# Patient Record
Sex: Male | Born: 1955
Health system: Southern US, Community
[De-identification: ages and names within clinical notes are randomized; demographics above are authoritative.]

## PROBLEM LIST (undated history)

## (undated) DIAGNOSIS — Z87442 Personal history of urinary calculi: Secondary | ICD-10-CM

## (undated) DIAGNOSIS — M5126 Other intervertebral disc displacement, lumbar region: Secondary | ICD-10-CM

## (undated) DIAGNOSIS — M5416 Radiculopathy, lumbar region: Secondary | ICD-10-CM

## (undated) DIAGNOSIS — E119 Type 2 diabetes mellitus without complications: Secondary | ICD-10-CM

## (undated) DIAGNOSIS — E785 Hyperlipidemia, unspecified: Secondary | ICD-10-CM

## (undated) DIAGNOSIS — M1990 Unspecified osteoarthritis, unspecified site: Secondary | ICD-10-CM

## (undated) DIAGNOSIS — B019 Varicella without complication: Secondary | ICD-10-CM

## (undated) DIAGNOSIS — M23204 Derangement of unspecified medial meniscus due to old tear or injury, left knee: Secondary | ICD-10-CM

## (undated) DIAGNOSIS — E781 Pure hyperglyceridemia: Secondary | ICD-10-CM

## (undated) HISTORY — DX: Personal history of urinary calculi: Z87.442

## (undated) HISTORY — PX: TRACHEOSTOMY: SUR1362

## (undated) HISTORY — DX: Varicella without complication: B01.9

## (undated) HISTORY — DX: Unspecified osteoarthritis, unspecified site: M19.90

## (undated) HISTORY — PX: TONSILECTOMY, ADENOIDECTOMY, BILATERAL MYRINGOTOMY AND TUBES: SHX2538

## (undated) HISTORY — DX: Type 2 diabetes mellitus without complications: E11.9

## (undated) HISTORY — DX: Other intervertebral disc displacement, lumbar region: M51.26

## (undated) HISTORY — DX: Radiculopathy, lumbar region: M54.16

## (undated) HISTORY — DX: Pure hyperglyceridemia: E78.1

## (undated) HISTORY — DX: Hyperlipidemia, unspecified: E78.5

## (undated) HISTORY — DX: Derangement of unspecified medial meniscus due to old tear or injury, left knee: M23.204

---

## 2005-02-12 ENCOUNTER — Inpatient Hospital Stay (HOSPITAL_COMMUNITY): Admission: EM | Admit: 2005-02-12 | Discharge: 2005-02-16 | Payer: Self-pay | Admitting: Emergency Medicine

## 2005-03-04 ENCOUNTER — Encounter: Admission: RE | Admit: 2005-03-04 | Discharge: 2005-03-04 | Payer: Self-pay | Admitting: Neurosurgery

## 2005-05-06 ENCOUNTER — Encounter: Admission: RE | Admit: 2005-05-06 | Discharge: 2005-05-06 | Payer: Self-pay | Admitting: Neurosurgery

## 2005-06-03 ENCOUNTER — Encounter: Admission: RE | Admit: 2005-06-03 | Discharge: 2005-06-03 | Payer: Self-pay | Admitting: Neurosurgery

## 2010-08-08 ENCOUNTER — Other Ambulatory Visit: Payer: Self-pay | Admitting: Family Medicine

## 2010-08-08 DIAGNOSIS — N644 Mastodynia: Secondary | ICD-10-CM

## 2010-08-11 ENCOUNTER — Ambulatory Visit
Admission: RE | Admit: 2010-08-11 | Discharge: 2010-08-11 | Disposition: A | Discharge status: Home / Self Care | Payer: BC Managed Care – PPO | Source: Ambulatory Visit | Attending: Family Medicine | Admitting: Family Medicine

## 2010-08-11 DIAGNOSIS — N644 Mastodynia: Secondary | ICD-10-CM

## 2015-12-16 DIAGNOSIS — J329 Chronic sinusitis, unspecified: Secondary | ICD-10-CM | POA: Insufficient documentation

## 2015-12-16 DIAGNOSIS — J3089 Other allergic rhinitis: Secondary | ICD-10-CM | POA: Insufficient documentation

## 2016-06-25 ENCOUNTER — Encounter: Payer: Self-pay | Admitting: Family Medicine

## 2016-06-26 ENCOUNTER — Other Ambulatory Visit: Payer: Self-pay | Admitting: Family Medicine

## 2016-06-26 DIAGNOSIS — M5416 Radiculopathy, lumbar region: Secondary | ICD-10-CM

## 2016-06-29 ENCOUNTER — Ambulatory Visit
Admission: RE | Admit: 2016-06-29 | Discharge: 2016-06-29 | Disposition: A | Discharge status: Home / Self Care | Payer: BLUE CROSS/BLUE SHIELD | Source: Ambulatory Visit | Attending: Family Medicine | Admitting: Family Medicine

## 2016-06-29 DIAGNOSIS — M5416 Radiculopathy, lumbar region: Secondary | ICD-10-CM

## 2016-09-02 LAB — VITAMIN D 25 HYDROXY (VIT D DEFICIENCY, FRACTURES): Vit D, 25-Hydroxy: 25.3

## 2016-09-02 LAB — CBC AND DIFFERENTIAL
HCT: 45 (ref 41–53)
Hemoglobin: 15.3 (ref 13.5–17.5)
WBC: 10.6

## 2016-09-02 LAB — BASIC METABOLIC PANEL
BUN: 15 (ref 4–21)
Creatinine: 1.1 (ref <=1.3)
Glucose: 124

## 2016-09-02 LAB — TSH: TSH: 1.89 (ref <=5.90)

## 2016-09-02 LAB — LIPID PANEL
Cholesterol: 163 (ref 0–200)
HDL: 40 (ref 35–70)
LDL Cholesterol: 124
Triglycerides: 215 — AB (ref 40–160)

## 2016-09-02 LAB — HEPATIC FUNCTION PANEL
ALT: 37 (ref 10–40)
AST: 30 (ref 14–40)

## 2017-04-28 ENCOUNTER — Encounter: Payer: Self-pay | Admitting: Family Medicine

## 2017-09-16 ENCOUNTER — Ambulatory Visit: Payer: 59 | Admitting: Family Medicine

## 2017-09-16 ENCOUNTER — Encounter: Payer: Self-pay | Admitting: Family Medicine

## 2017-09-16 ENCOUNTER — Other Ambulatory Visit: Payer: Self-pay

## 2017-09-16 VITALS — BP 132/82 | HR 97 | Temp 98.7°F | Resp 17 | Ht 66.5 in | Wt 233.0 lb

## 2017-09-16 DIAGNOSIS — M19049 Primary osteoarthritis, unspecified hand: Secondary | ICD-10-CM | POA: Insufficient documentation

## 2017-09-16 DIAGNOSIS — J208 Acute bronchitis due to other specified organisms: Secondary | ICD-10-CM | POA: Diagnosis not present

## 2017-09-16 DIAGNOSIS — M5416 Radiculopathy, lumbar region: Secondary | ICD-10-CM

## 2017-09-16 DIAGNOSIS — E782 Mixed hyperlipidemia: Secondary | ICD-10-CM | POA: Diagnosis not present

## 2017-09-16 DIAGNOSIS — R7303 Prediabetes: Secondary | ICD-10-CM | POA: Diagnosis not present

## 2017-09-16 DIAGNOSIS — M5126 Other intervertebral disc displacement, lumbar region: Secondary | ICD-10-CM | POA: Insufficient documentation

## 2017-09-16 DIAGNOSIS — E669 Obesity, unspecified: Secondary | ICD-10-CM | POA: Insufficient documentation

## 2017-09-16 DIAGNOSIS — B9689 Other specified bacterial agents as the cause of diseases classified elsewhere: Secondary | ICD-10-CM | POA: Diagnosis not present

## 2017-09-16 DIAGNOSIS — L740 Miliaria rubra: Secondary | ICD-10-CM | POA: Insufficient documentation

## 2017-09-16 HISTORY — DX: Radiculopathy, lumbar region: M54.16

## 2017-09-16 HISTORY — DX: Other intervertebral disc displacement, lumbar region: M51.26

## 2017-09-16 MED ORDER — AZITHROMYCIN 250 MG PO TABS: ORAL_TABLET | ORAL | 0 refills | Status: DC

## 2017-09-16 NOTE — Patient Instructions (Signed)
Please return in 1-3 months for your annual complete physical; please come fasting.   It was a pleasure meeting you today! Thank you for choosing [place] to meet your healthcare needs! I truly look forward to working with you. If you have any questions or concerns, please send me a message via Mychart or call the office at [phone].  You may use Delsym cough syrup or Mucinex DM to help with congestion and coughing.   Acute Bronchitis, Adult Acute bronchitis is sudden (acute) swelling of the air tubes (bronchi) in the lungs. Acute bronchitis causes these tubes to fill with mucus, which can make it hard to breathe. It can also cause coughing or wheezing. In adults, acute bronchitis usually goes away within 2 weeks. A cough caused by bronchitis may last up to 3 weeks. Smoking, allergies, and asthma can make the condition worse. Repeated episodes of bronchitis may cause further lung problems, such as chronic obstructive pulmonary disease (COPD). What are the causes? This condition can be caused by germs and by substances that irritate the lungs, including:  Cold and flu viruses. This condition is most often caused by the same virus that causes a cold.  Bacteria.  Exposure to tobacco smoke, dust, fumes, and air pollution.  What increases the risk? This condition is more likely to develop in people who:  Have close contact with someone with acute bronchitis.  Are exposed to lung irritants, such as tobacco smoke, dust, fumes, and vapors.  Have a weak immune system.  Have a respiratory condition such as asthma.  What are the signs or symptoms? Symptoms of this condition include:  A cough.  Coughing up clear, yellow, or green mucus.  Wheezing.  Chest congestion.  Shortness of breath.  A fever.  Body aches.  Chills.  A sore throat.  How is this diagnosed? This condition is usually diagnosed with a physical exam. During the exam, your health care provider may order tests,  such as chest X-rays, to rule out other conditions. He or she may also:  Test a sample of your mucus for bacterial infection.  Check the level of oxygen in your blood. This is done to check for pneumonia.  Do a chest X-ray or lung function testing to rule out pneumonia and other conditions.  Perform blood tests.  Your health care provider will also ask about your symptoms and medical history. How is this treated? Most cases of acute bronchitis clear up over time without treatment. Your health care provider may recommend:  Drinking more fluids. Drinking more makes your mucus thinner, which may make it easier to breathe.  Taking a medicine for a fever or cough.  Taking an antibiotic medicine.  Using an inhaler to help improve shortness of breath and to control a cough.  Using a cool mist vaporizer or humidifier to make it easier to breathe.  Follow these instructions at home: Medicines  Take over-the-counter and prescription medicines only as told by your health care provider.  If you were prescribed an antibiotic, take it as told by your health care provider. Do not stop taking the antibiotic even if you start to feel better. General instructions  Get plenty of rest.  Drink enough fluids to keep your urine clear or pale yellow.  Avoid smoking and secondhand smoke. Exposure to cigarette smoke or irritating chemicals will make bronchitis worse. If you smoke and you need help quitting, ask your health care provider. Quitting smoking will help your lungs heal faster.  Use an  inhaler, cool mist vaporizer, or humidifier as told by your health care provider.  Keep all follow-up visits as told by your health care provider. This is important. How is this prevented? To lower your risk of getting this condition again:  Wash your hands often with soap and water. If soap and water are not available, use hand sanitizer.  Avoid contact with people who have cold symptoms.  Try not to  touch your hands to your mouth, nose, or eyes.  Make sure to get the flu shot every year.  Contact a health care provider if:  Your symptoms do not improve in 2 weeks of treatment. Get help right away if:  You cough up blood.  You have chest pain.  You have severe shortness of breath.  You become dehydrated.  You faint or keep feeling like you are going to faint.  You keep vomiting.  You have a severe headache.  Your fever or chills gets worse. This information is not intended to replace advice given to you by your health care provider. Make sure you discuss any questions you have with your health care provider. Document Released: 06/25/2004 Document Revised: 12/11/2015 Document Reviewed: 11/06/2015 Elsevier Interactive Patient Education  [employer]2018 [employer]

## 2017-09-16 NOTE — Progress Notes (Signed)
Subjective  CC:  Chief Complaint  Patient presents with  . Sinus Problem    Started Monday and had cramps and diarrhea, better Tuesday, noticed sore throat, drainage, facial pressure, dark mucous, coughing, red bumps under right armpit    HPI: Karl [name] is a 62 y.o. male who presents to [facility] today to establish care with me as a new patient.  Former patient of [facility], records not yet available for review. Last CPE was about a year ago. Had blood work at work but doesn't have a copy; that was from nov 2018.  He has the following concerns or needs:  2-3 days of uri sxs with chest congestion, cough, pnd w/o fevers or sob. Had diarrhea but thinks that was related to a food intolerance. No GI sxs now. No significant myalgias. Prefers abx due to h/o recurrent sinus infections. No sinus pain now. I have reviewed old records from ENT.   Hyperlipidemia on statin and fibrate. Weight is up. Eats general diet with little exercise. Due for recheck. No AEs from meds.   H/o back pain evaluation documented below. Pretty well controlled now.   Hand OA  Reports h/o prediabetes: fastings at home (uses wife's meter on occasion) are elevated to 120. Never treated or dxd with diabetes. No sxs of hyperglycemia.   HM: due for CPE with labs, shingrix, tdap booster  We updated and reviewed the patient's past history in detail and it is documented below.  Patient Active Problem List   Diagnosis Date Noted  . Mixed hyperlipidemia 09/16/2017  . Heat rash 09/16/2017    Recurring;    . Severe obesity (BMI 35.0-39.9) with comorbidity (HCC) 09/16/2017  . Lumbar herniated disc 09/16/2017    L3,4,5 by MRI, NeuroSurgery   . Left lumbar radiculopathy 09/16/2017    Evaluated by NS and treated with PT, [place].    . Osteoarthritis, hand 09/16/2017  . Non-seasonal allergic rhinitis 12/16/2015  . Recurrent sinusitis 12/16/2015   Health Maintenance  Topic Date  Due  . Hepatitis C Screening  1956/01/19  . HIV Screening  11/16/1971  . TETANUS/TDAP  11/16/1975  . INFLUENZA VACCINE  12/30/2017  . COLONOSCOPY  01/31/2027   Immunization History  Administered Date(s) Administered  . Influenza Inj Mdck Quad Pf 03/08/2017   Current Meds  Medication Sig  . augmented betamethasone dipropionate (DIPROLENE-AF) 0.05 % cream APPLY A THIN FILM EXTERNALLY TO AFFECTED AREA. APPLY TWICE DAILY TO AFFECTED AREA ON HAND.  Marland Kitchen. Coenzyme Q10 (CO Q 10) 10 MG CAPS Take by mouth.  . fenofibrate 160 MG tablet TAKE ONE TABLET BY MOUTH ONE TIME DAILY**30 DAYS SUPPLY ONLY*-*  . fluticasone (FLONASE) 50 MCG/ACT nasal spray Place into the nose.  . Multiple Vitamins-Minerals (CENTRUM SILVER 50+MEN PO) Take by mouth.  . Multiple Vitamins-Minerals (OCUVITE ADULT 50+ PO) Take by mouth.  . Naproxen Sodium (ALEVE) 220 MG CAPS Take by mouth.  . Omega 3-6-9 Fatty Acids (OMEGA 3-6-9 COMPLEX PO) Take by mouth.  . simvastatin (ZOCOR) 80 MG tablet TAKE 1/2 TABLET IN THE EVENING ONCE A DAY ORALLY 30 DAYS    Allergies: Patient has No Known Allergies. Past Medical History Patient  has a past medical history of Arthritis, Chicken pox, High triglycerides, Hyperlipidemia, Left lumbar radiculopathy (09/16/2017), and Lumbar herniated disc (09/16/2017). Past Surgical History Patient  has a past surgical history that includes Tonsilectomy, adenoidectomy, bilateral myringotomy and tubes and Tracheostomy. Family History: Patient family history includes Arthritis in his  father and maternal grandmother; Asthma in his mother; Cancer in his maternal grandfather, maternal grandmother, paternal grandfather, and paternal grandmother; Depression in his sister; Diabetes in his mother; Heart attack in his father; Heart disease in his father and paternal grandfather; Hyperlipidemia in his brother, father, and mother; Kidney disease in his mother; Mental illness in his sister; Miscarriages / Stillbirths in his  mother; Stroke in his mother. Social History:  Patient  reports that he has quit smoking. He has never used smokeless tobacco. He reports that he drank alcohol. He reports that he does not use drugs.  Review of Systems: Constitutional: negative for fever or malaise Ophthalmic: negative for photophobia, double vision or loss of vision Cardiovascular: negative for chest pain, dyspnea on exertion, or new LE swelling Respiratory: negative for SOB or persistent cough Gastrointestinal: negative for abdominal pain, change in bowel habits or melena Genitourinary: negative for dysuria or gross hematuria Musculoskeletal: negative for new gait disturbance or muscular weakness Integumentary: negative for new or persistent rashes Neurological: negative for TIA or stroke symptoms Psychiatric: negative for SI or delusions Allergic/Immunologic: negative for hives  Patient Care Team    Relationship Specialty Notifications Start End  Willow Ora, MD PCP - General Family Medicine  09/16/17   Christia Reading, MD Consulting Physician Otolaryngology  09/16/17   Ollen Gross, MD Consulting Physician Orthopedic Surgery  09/16/17   Marzella Schlein., MD  Ophthalmology  09/16/17   Dr. Ubaldo Glassing    09/16/17    Comment: Dentist    Objective  Vitals: BP 132/82   Pulse 97   Temp 98.7 F (37.1 C) (Oral)   Resp 17   Ht 5' 6.5" (1.689 m)   Wt 233 lb (105.7 kg)   SpO2 97%   BMI 37.04 kg/m  General:  Well developed, well nourished, no acute distress  Psych:  Alert and oriented,normal mood and affect HEENT:  Normocephalic, atraumatic, non-icteric sclera, PERRL, oropharynx is without mass or exudate, posterior pharynx is mildly red w/o exudate. No sinus ttp, supple neck with anterior nontender adenopathy, no mass or thyromegaly Cardiovascular:  RRR without gallop, rub or murmur, nondisplaced PMI Respiratory:  Good breath sounds bilaterally, CTAB with normal respiratory effort Gastrointestinal: normal bowel sounds,  soft, non-tender, no noted masses. No HSM MSK: OA changes in right DIPs Skin:  Warm, right axilla with petechial bruising   Assessment  1. Acute bacterial bronchitis   2. Mixed hyperlipidemia   3. Severe obesity (BMI 35.0-39.9) with comorbidity (HCC)   4. Prediabetes      Plan   Treat for bacterial bronchitis with abx and supportive meds.   Discussed nutrition - low fat diet. Continue statin. Will need fasting labwork at next visit  Work on weight reduction.   Will check sugars; concerning for diabetes. Education given.   HM: update imms at cpe  Follow up:  1-3 months for cpe. Come fasting  Commons side effects, risks, benefits, and alternatives for medications and treatment plan prescribed today were discussed, and the patient expressed understanding of the given instructions. Patient is instructed to call or message via MyChart if he/she has any questions or concerns regarding our treatment plan. No barriers to understanding were identified. We discussed Red Flag symptoms and signs in detail. Patient expressed understanding regarding what to do in case of urgent or emergency type symptoms.   Medication list was reconciled, printed and provided to the patient in AVS. Patient instructions and summary information was reviewed with the patient as documented in  the AVS. This note was prepared with assistance of Dragon voice recognition software. Occasional wrong-word or sound-a-like substitutions may have occurred due to the inherent limitations of voice recognition software  No orders of the defined types were placed in this encounter.  Meds ordered this encounter  Medications  . azithromycin (ZITHROMAX) 250 MG tablet    Sig: Take 2 tabs today, then 1 tab daily for 4 days    Dispense:  1 each    Refill:  0

## 2017-10-13 ENCOUNTER — Encounter: Payer: Self-pay | Admitting: Emergency Medicine

## 2017-10-29 LAB — LIPID PANEL
Cholesterol: 158 (ref 0–200)
HDL: 41 (ref 35–70)
LDL Cholesterol: 82
Triglycerides: 177 — AB (ref 40–160)

## 2017-10-31 ENCOUNTER — Other Ambulatory Visit: Payer: Self-pay | Admitting: Family Medicine

## 2017-11-01 ENCOUNTER — Encounter: Payer: Self-pay | Admitting: Emergency Medicine

## 2017-11-01 NOTE — Telephone Encounter (Signed)
Last OV 09/16/17, Next OV 12/10/17  Never filled this medication before  Please advise.

## 2017-11-01 NOTE — Telephone Encounter (Signed)
Received and reviewed medication refill request.  Request is appropriate and was approved.  Please see medication orders for details.  

## 2017-12-10 ENCOUNTER — Ambulatory Visit (INDEPENDENT_AMBULATORY_CARE_PROVIDER_SITE_OTHER): Payer: 59 | Admitting: Family Medicine

## 2017-12-10 ENCOUNTER — Encounter: Payer: Self-pay | Admitting: Family Medicine

## 2017-12-10 VITALS — BP 110/80 | HR 71 | Temp 98.3°F | Ht 67.5 in | Wt 235.4 lb

## 2017-12-10 DIAGNOSIS — E782 Mixed hyperlipidemia: Secondary | ICD-10-CM | POA: Diagnosis not present

## 2017-12-10 DIAGNOSIS — Z23 Encounter for immunization: Secondary | ICD-10-CM

## 2017-12-10 DIAGNOSIS — E119 Type 2 diabetes mellitus without complications: Secondary | ICD-10-CM | POA: Diagnosis not present

## 2017-12-10 DIAGNOSIS — M5416 Radiculopathy, lumbar region: Secondary | ICD-10-CM

## 2017-12-10 DIAGNOSIS — M23204 Derangement of unspecified medial meniscus due to old tear or injury, left knee: Secondary | ICD-10-CM

## 2017-12-10 DIAGNOSIS — Z1159 Encounter for screening for other viral diseases: Secondary | ICD-10-CM | POA: Diagnosis not present

## 2017-12-10 DIAGNOSIS — Z0000 Encounter for general adult medical examination without abnormal findings: Secondary | ICD-10-CM | POA: Diagnosis not present

## 2017-12-10 DIAGNOSIS — Z87898 Personal history of other specified conditions: Secondary | ICD-10-CM | POA: Diagnosis not present

## 2017-12-10 HISTORY — DX: Derangement of unspecified medial meniscus due to old tear or injury, left knee: M23.204

## 2017-12-10 HISTORY — DX: Type 2 diabetes mellitus without complications: E11.9

## 2017-12-10 LAB — CBC WITH DIFFERENTIAL/PLATELET
Basophils Absolute: 0.2 10*3/uL — ABNORMAL HIGH (ref 0.0–0.1)
Basophils Relative: 2.1 % (ref 0.0–3.0)
Eosinophils Absolute: 0.5 10*3/uL (ref 0.0–0.7)
Eosinophils Relative: 6.1 % — ABNORMAL HIGH (ref 0.0–5.0)
HCT: 46.7 % (ref 39.0–52.0)
Hemoglobin: 15.9 g/dL (ref 13.0–17.0)
Lymphocytes Relative: 22.6 % (ref 12.0–46.0)
Lymphs Abs: 1.9 10*3/uL (ref 0.7–4.0)
MCHC: 34.1 g/dL (ref 30.0–36.0)
MCV: 88.9 fl (ref 78.0–100.0)
Monocytes Absolute: 0.5 10*3/uL (ref 0.1–1.0)
Monocytes Relative: 5.7 % (ref 3.0–12.0)
Neutro Abs: 5.4 10*3/uL (ref 1.4–7.7)
Neutrophils Relative %: 63.5 % (ref 43.0–77.0)
Platelets: 302 10*3/uL (ref 150.0–400.0)
RBC: 5.26 Mil/uL (ref 4.22–5.81)
RDW: 13.9 % (ref 11.5–15.5)
WBC: 8.5 10*3/uL (ref 4.0–10.5)

## 2017-12-10 LAB — POCT GLYCOSYLATED HEMOGLOBIN (HGB A1C): Hemoglobin A1C: 6.5 % — AB (ref 4.0–5.6)

## 2017-12-10 LAB — MICROALBUMIN / CREATININE URINE RATIO
Creatinine,U: 190.5 mg/dL
Microalb Creat Ratio: 0.4 mg/g (ref 0.0–30.0)
Microalb, Ur: 0.8 mg/dL (ref 0.0–1.9)

## 2017-12-10 LAB — LIPID PANEL
Cholesterol: 150 mg/dL (ref 0–200)
HDL: 43.5 mg/dL (ref >39.00)
LDL Cholesterol: 74 mg/dL (ref 0–99)
NonHDL: 106.24
Total CHOL/HDL Ratio: 3
Triglycerides: 161 mg/dL — ABNORMAL HIGH (ref 0.0–149.0)
VLDL: 32.2 mg/dL (ref 0.0–40.0)

## 2017-12-10 LAB — COMPREHENSIVE METABOLIC PANEL
ALT: 26 U/L (ref 0–53)
AST: 20 U/L (ref 0–37)
Albumin: 4.4 g/dL (ref 3.5–5.2)
Alkaline Phosphatase: 35 U/L — ABNORMAL LOW (ref 39–117)
BUN: 15 mg/dL (ref 6–23)
CO2: 29 mEq/L (ref 19–32)
Calcium: 9.5 mg/dL (ref 8.4–10.5)
Chloride: 105 mEq/L (ref 96–112)
Creatinine, Ser: 1.11 mg/dL (ref 0.40–1.50)
GFR: 71.56 mL/min (ref >60.00)
Glucose, Bld: 136 mg/dL — ABNORMAL HIGH (ref 70–99)
Potassium: 4.4 mEq/L (ref 3.5–5.1)
Sodium: 142 mEq/L (ref 135–145)
Total Bilirubin: 0.5 mg/dL (ref 0.2–1.2)
Total Protein: 6.7 g/dL (ref 6.0–8.3)

## 2017-12-10 LAB — TSH: TSH: 3.36 u[IU]/mL (ref 0.35–4.50)

## 2017-12-10 NOTE — Progress Notes (Signed)
Subjective  Chief Complaint  Patient presents with  . Annual Exam  . Hyperglycemia  . Hyperlipidemia    HPI: Karl [name] is a 62 y.o. male who presents to [facility] at [facility] today for a Male Wellness Visit. He also has the concerns and/or needs as listed above in the chief complaint. These will be addressed in addition to the Health Maintenance Visit.   Wellness Visit: annual visit with health maintenance review and exam    HM: due for shingrix. Discussed Prostate cancer screening recs; no PSA today. Diet is variable. Traveled to [place] for two weeks; walked daily.  Lifestyle: Body mass index is 36.32 kg/m. Wt Readings from Last 3 Encounters:  12/10/17 235 lb 6.4 oz (106.8 kg)  09/16/17 233 lb (105.7 kg)   Diet: general Exercise: intermittently, walking  Chronic disease management visit and/or acute problem visit:  H/o diabetes: [facility], a1c was 6.05 Jun 2016; worked on diet and had it down some. Never treated or diagnosed. No foot concerns. Discussed his diet.   HLD mixed on meds. Due for recheck. Check lfts. No AEs.   Back pain - left sciatica active due to recent flying trips. No weakness. Doing some stretches. No b/b/ dysfunction  Patient Active Problem List   Diagnosis Date Noted  . Diet-controlled diabetes mellitus (HCC) 12/10/2017    Priority: High  . Mixed hyperlipidemia 09/16/2017    Priority: High  . Severe obesity (BMI 35.0-39.9) with comorbidity (HCC) 09/16/2017    Priority: High  . Lumbar herniated disc 09/16/2017    Priority: Medium  . Left lumbar radiculopathy 09/16/2017    Priority: Medium  . Osteoarthritis, hand 09/16/2017    Priority: Medium  . Recurrent sinusitis 12/16/2015    Priority: Medium  . Degenerative tear of left medial meniscus 12/10/2017    Priority: Low  . Heat rash 09/16/2017    Priority: Low  . Non-seasonal allergic rhinitis 12/16/2015    Priority: Low   Health Maintenance  Topic  Date Due  . Hepatitis C Screening  24-Jan-1956  . PNEUMOCOCCAL POLYSACCHARIDE VACCINE (1) 11/16/1958  . FOOT EXAM  11/16/1966  . URINE MICROALBUMIN  11/16/1966  . HIV Screening  11/16/1971  . INFLUENZA VACCINE  12/30/2017  . OPHTHALMOLOGY EXAM  06/01/2018  . HEMOGLOBIN A1C  06/12/2018  . TETANUS/TDAP  06/02/2023  . COLONOSCOPY  01/31/2027   Immunization History  Administered Date(s) Administered  . Influenza Inj Mdck Quad Pf 03/08/2017  . Tdap 06/01/2013  . Zoster Recombinat (Shingrix) 12/10/2017   We updated and reviewed the patient's past history in detail and it is documented below. Allergies: Patient has No Known Allergies. Past Medical History  has a past medical history of Arthritis, Chicken pox, Degenerative tear of left medial meniscus (12/10/2017), Diet-controlled diabetes mellitus (HCC) (12/10/2017), High triglycerides, Hyperlipidemia, Left lumbar radiculopathy (09/16/2017), and Lumbar herniated disc (09/16/2017). Past Surgical History Patient  has a past surgical history that includes Tonsilectomy, adenoidectomy, bilateral myringotomy and tubes and Tracheostomy. Social History Patient  reports that he has quit smoking. He has never used smokeless tobacco. He reports that he drank alcohol. He reports that he does not use drugs. Family History family history includes Arthritis in his father and maternal grandmother; Asthma in his mother; Cancer in his maternal grandfather, maternal grandmother, paternal grandfather, and paternal grandmother; Depression in his sister; Diabetes in his mother; Heart attack in his father; Heart disease in his father and paternal grandfather; Hyperlipidemia in his brother, father, and mother;  Kidney disease in his mother; Mental illness in his sister; Miscarriages / Stillbirths in his mother; Stroke in his mother. Review of Systems: Constitutional: negative for fever or malaise Ophthalmic: negative for photophobia, double vision or loss of  vision Cardiovascular: negative for chest pain, dyspnea on exertion, or new LE swelling Respiratory: negative for SOB or persistent cough Gastrointestinal: negative for abdominal pain, change in bowel habits or melena Genitourinary: negative for dysuria or gross hematuria Musculoskeletal: negative for new gait disturbance or muscular weakness Integumentary: negative for new or persistent rashes Neurological: negative for TIA or stroke symptoms Psychiatric: negative for SI or delusions Allergic/Immunologic: negative for hives  Patient Care Team    Relationship Specialty Notifications Start End  Willow OraAndy, Camille L, MD PCP - General Family Medicine  09/16/17   Christia ReadingBates, Dwight, MD Consulting Physician Otolaryngology  09/16/17   Ollen GrossAluisio, Frank, MD Consulting Physician Orthopedic Surgery  09/16/17   Marzella SchleinWood, Craig H., MD  Ophthalmology  09/16/17   Dr. Ubaldo GlassingLindthum    09/16/17    Comment: Dentist   Objective  Vitals: BP 110/80 (BP Location: Left Arm, Patient Position: Sitting, Cuff Size: Large)   Pulse 71   Temp 98.3 F (36.8 C) (Oral)   Ht 5' 7.5" (1.715 m)   Wt 235 lb 6.4 oz (106.8 kg)   SpO2 96%   BMI 36.32 kg/m  General:  Well developed, well nourished, no acute distress  Psych:  Alert and orientedx3,normal mood and affect HEENT:  Normocephalic, atraumatic, non-icteric sclera, PERRL, oropharynx is clear without mass or exudate, supple neck without adenopathy, mass or thyromegaly Cardiovascular:  Normal S1, S2, RRR without gallop, rub or murmur, nondisplaced PMI, +2 distal pulses in bilateral upper and lower extremities. Respiratory:  Good breath sounds bilaterally, CTAB with normal respiratory effort Gastrointestinal: normal bowel sounds, soft, non-tender, no noted masses. No HSM, small umbilical hernia MSK: no deformities, contusions. Joints are without erythema or swelling. Spine and CVA region are nontender Skin:  Warm, no rashes or suspicious lesions noted Neurologic:    Mental status is  normal. CN 2-11 are normal. Gross motor and sensory exams are normal. Stable gait. No tremor GU: No inguinal hernias or adenopathy are appreciated bilaterally   Lab Results  Component Value Date   HGBA1C 6.5 (A) 12/10/2017     Assessment  1. Annual physical exam   2. Mixed hyperlipidemia   3. Severe obesity (BMI 35.0-39.9) with comorbidity (HCC)   4. History of prediabetes   5. Need for hepatitis C screening test   6. Need for shingles vaccine   7. Diet-controlled diabetes mellitus (HCC)   8. Left lumbar radiculopathy      Plan  Male Wellness Visit:  Age appropriate Health Maintenance and Prevention measures were discussed with patient. Included topics are cancer screening recommendations, ways to keep healthy (see AVS) including dietary and exercise recommendations, regular eye and dental care, use of seat belts, and avoidance of moderate alcohol use and tobacco use.   BMI: discussed patient's BMI and encouraged positive lifestyle modifications to help get to or maintain a target BMI.  HM needs and immunizations were addressed and ordered. See below for orders. See HM and immunization section for updates. shingrix today; pneumovax next visit if a1c remains elevated.  Routine labs and screening tests ordered including cmp, cbc and lipids where appropriate.  Discussed recommendations regarding Vit D and calcium supplementation (see AVS)  Chronic disease f/u and/or acute problem visit: (deemed necessary to be done in addition to the wellness  visit):  Diabetes: discussed dx; continue dietary mgt. See AVS. Check urine. Eye exam up to date. Consider daily asa  HLD: check levels and lfts  Sciatica: continue stretches  Work on weight loss  Follow up: Return in about 3 months (around 03/12/2018) for follow up Diabetes.   Commons side effects, risks, benefits, and alternatives for medications and treatment plan prescribed today were discussed, and the patient expressed  understanding of the given instructions. Patient is instructed to call or message via MyChart if he/she has any questions or concerns regarding our treatment plan. No barriers to understanding were identified. We discussed Red Flag symptoms and signs in detail. Patient expressed understanding regarding what to do in case of urgent or emergency type symptoms.   Medication list was reconciled, printed and provided to the patient in AVS. Patient instructions and summary information was reviewed with the patient as documented in the AVS. This note was prepared with assistance of Dragon voice recognition software. Occasional wrong-word or sound-a-like substitutions may have occurred due to the inherent limitations of voice recognition software  Orders Placed This Encounter  Procedures  . Varicella-zoster vaccine IM (Shingrix)  . CBC with Differential/Platelet  . Comprehensive metabolic panel  . Lipid panel  . HIV antibody  . TSH  . Hepatitis C antibody  . Microalbumin / creatinine urine ratio  . POCT glycosylated hemoglobin (Hb A1C)   No orders of the defined types were placed in this encounter.

## 2017-12-10 NOTE — Patient Instructions (Signed)
Please return in 3 months for diabetes follow up   If you have any questions or concerns, please don't hesitate to send me a message via MyChart or call the office at [phone]. Thank you for visiting with us today! It's our pleasure caring for you.  Please do these things to maintain good health!   Exercise at least 30-45 minutes a day,  4-5 days a week.   Eat a low-fat diet with lots of fruits and vegetables, up to 7-9 servings per day.  Drink plenty of water daily. Try to drink 8 8oz glasses per day.  Seatbelts can save your life. Always wear your seatbelt.  Place Smoke Detectors on every level of your home and check batteries every year.  Eye Doctor - have an eye exam every 1-2 years  Safe sex - use condoms to protect yourself from STDs if you could be exposed to these types of infections.  Avoid heavy alcohol use. If you drink, keep it to less than 2 drinks/day and not every day.  Health Care Power of Attorney.  Choose someone you trust that could speak for you if you became unable to speak for yourself.  Depression is common in our stressful world.If you're feeling down or losing interest in things you normally enjoy, please come in for a visit.   Diabetes Mellitus and Nutrition When you have diabetes (diabetes mellitus), it is very important to have healthy eating habits because your blood sugar (glucose) levels are greatly affected by what you eat and drink. Eating healthy foods in the appropriate amounts, at about the same times every day, can help you:  Control your blood glucose.  Lower your risk of heart disease.  Improve your blood pressure.  Reach or maintain a healthy weight.  Every person with diabetes is different, and each person has different needs for a meal plan. Your health care provider may recommend that you work with a diet and nutrition specialist (dietitian) to make a meal plan that is best for you. Your meal plan may vary depending on factors  such as:  The calories you need.  The medicines you take.  Your weight.  Your blood glucose, blood pressure, and cholesterol levels.  Your activity level.  Other health conditions you have, such as heart or kidney disease.  How do carbohydrates affect me? Carbohydrates affect your blood glucose level more than any other type of food. Eating carbohydrates naturally increases the amount of glucose in your blood. Carbohydrate counting is a method for keeping track of how many carbohydrates you eat. Counting carbohydrates is important to keep your blood glucose at a healthy level, especially if you use insulin or take certain oral diabetes medicines. It is important to know how many carbohydrates you can safely have in each meal. This is different for every person. Your dietitian can help you calculate how many carbohydrates you should have at each meal and for snack. Foods that contain carbohydrates include:  Bread, cereal, rice, pasta, and crackers.  Potatoes and corn.  Peas, beans, and lentils.  Milk and yogurt.  Fruit and juice.  Desserts, such as cakes, cookies, ice cream, and candy.  How does alcohol affect me? Alcohol can cause a sudden decrease in blood glucose (hypoglycemia), especially if you use insulin or take certain oral diabetes medicines. Hypoglycemia can be a life-threatening condition. Symptoms of hypoglycemia (sleepiness, dizziness, and confusion) are similar to symptoms of having too much alcohol. If your health care provider says that alcohol is  safe for you, follow these guidelines:  Limit alcohol intake to no more than 1 drink per day for nonpregnant women and 2 drinks per day for men. One drink equals 12 oz of beer, 5 oz of wine, or 1 oz of hard liquor.  Do not drink on an empty stomach.  Keep yourself hydrated with water, diet soda, or unsweetened iced tea.  Keep in mind that regular soda, juice, and other mixers may contain a lot of sugar and must be  counted as carbohydrates.  What are tips for following this plan? Reading food labels  Start by checking the serving size on the label. The amount of calories, carbohydrates, fats, and other nutrients listed on the label are based on one serving of the food. Many foods contain more than one serving per package.  Check the total grams (g) of carbohydrates in one serving. You can calculate the number of servings of carbohydrates in one serving by dividing the total carbohydrates by 15. For example, if a food has 30 g of total carbohydrates, it would be equal to 2 servings of carbohydrates.  Check the number of grams (g) of saturated and trans fats in one serving. Choose foods that have low or no amount of these fats.  Check the number of milligrams (mg) of sodium in one serving. Most people should limit total sodium intake to less than 2,300 mg per day.  Always check the nutrition information of foods labeled as "low-fat" or "nonfat". These foods may be higher in added sugar or refined carbohydrates and should be avoided.  Talk to your dietitian to identify your daily goals for nutrients listed on the label. Shopping  Avoid buying canned, premade, or processed foods. These foods tend to be high in fat, sodium, and added sugar.  Shop around the outside edge of the grocery store. This includes fresh fruits and vegetables, bulk grains, fresh meats, and fresh dairy. Cooking  Use low-heat cooking methods, such as baking, instead of high-heat cooking methods like deep frying.  Cook using healthy oils, such as olive, canola, or sunflower oil.  Avoid cooking with butter, cream, or high-fat meats. Meal planning  Eat meals and snacks regularly, preferably at the same times every day. Avoid going long periods of time without eating.  Eat foods high in fiber, such as fresh fruits, vegetables, beans, and whole grains. Talk to your dietitian about how many servings of carbohydrates you can eat at  each meal.  Eat 4-6 ounces of lean protein each day, such as lean meat, chicken, fish, eggs, or tofu. 1 ounce is equal to 1 ounce of meat, chicken, or fish, 1 egg, or 1/4 cup of tofu.  Eat some foods each day that contain healthy fats, such as avocado, nuts, seeds, and fish. Lifestyle   Check your blood glucose regularly.  Exercise at least 30 minutes 5 or more days each week, or as told by your health care provider.  Take medicines as told by your health care provider.  Do not use any products that contain nicotine or tobacco, such as cigarettes and e-cigarettes. If you need help quitting, ask your health care provider.  Work with a Veterinary surgeon or diabetes educator to identify strategies to manage stress and any emotional and social challenges. What are some questions to ask my health care provider?  Do I need to meet with a diabetes educator?  Do I need to meet with a dietitian?  What number can I call if I have questions?  When are the best times to check my blood glucose? Where to find more information:  American Diabetes Association: diabetes.org/food-and-fitness/food  Academy of Nutrition and Dietetics: https://www.vargas.com/  [employer] of Diabetes and Digestive and Kidney Diseases (NIH): FindJewelers.cz Summary  A healthy meal plan will help you control your blood glucose and maintain a healthy lifestyle.  Working with a diet and nutrition specialist (dietitian) can help you make a meal plan that is best for you.  Keep in mind that carbohydrates and alcohol have immediate effects on your blood glucose levels. It is important to count carbohydrates and to use alcohol carefully. This information is not intended to replace advice given to you by your health care provider. Make sure you discuss any questions you have with your health care  provider. Document Released: 02/12/2005 Document Revised: 06/22/2016 Document Reviewed: 06/22/2016 Elsevier Interactive Patient Education  [employer].

## 2017-12-11 LAB — HIV ANTIBODY (ROUTINE TESTING W REFLEX): HIV: NONREACTIVE

## 2017-12-11 LAB — HEPATITIS C ANTIBODY
Hepatitis C Ab: NONREACTIVE
SIGNAL TO CUT-OFF: 0.02 (ref <1.00)

## 2018-01-04 ENCOUNTER — Telehealth: Payer: Self-pay | Admitting: Emergency Medicine

## 2018-01-04 MED ORDER — SIMVASTATIN 80 MG PO TABS: ORAL_TABLET | ORAL | 2 refills | Status: DC

## 2018-01-04 NOTE — Telephone Encounter (Signed)
Copied from CRM [id number]. Topic: General - Other >> Jan 04, 2018  9:14 AM Percival SpanishKennedy, Cheryl W wrote:  Pt is new to Dr Mardelle MatteAndy and is asking if she will refill the below med  simvastatin (ZOCOR) 80 MG tablet  [facility] [address]

## 2018-01-04 NOTE — Telephone Encounter (Signed)
[facility]vastatin sent to [facility] [address] [facility] [address]. Patient informed.   Kathi SimpersAmy Peterman,  LPN

## 2018-01-27 ENCOUNTER — Other Ambulatory Visit: Payer: Self-pay | Admitting: Family Medicine

## 2018-01-27 DIAGNOSIS — Z23 Encounter for immunization: Secondary | ICD-10-CM | POA: Diagnosis not present

## 2018-03-18 ENCOUNTER — Ambulatory Visit: Payer: 59 | Admitting: Family Medicine

## 2018-03-21 ENCOUNTER — Other Ambulatory Visit: Payer: Self-pay

## 2018-03-21 ENCOUNTER — Encounter: Payer: Self-pay | Admitting: Family Medicine

## 2018-03-21 ENCOUNTER — Ambulatory Visit: Payer: 59 | Admitting: Family Medicine

## 2018-03-21 VITALS — BP 128/82 | HR 75 | Temp 98.7°F | Resp 18 | Ht 67.5 in | Wt 236.0 lb

## 2018-03-21 DIAGNOSIS — E782 Mixed hyperlipidemia: Secondary | ICD-10-CM | POA: Diagnosis not present

## 2018-03-21 DIAGNOSIS — E1165 Type 2 diabetes mellitus with hyperglycemia: Secondary | ICD-10-CM

## 2018-03-21 DIAGNOSIS — L304 Erythema intertrigo: Secondary | ICD-10-CM

## 2018-03-21 DIAGNOSIS — IMO0001 Reserved for inherently not codable concepts without codable children: Secondary | ICD-10-CM

## 2018-03-21 DIAGNOSIS — Z23 Encounter for immunization: Secondary | ICD-10-CM

## 2018-03-21 DIAGNOSIS — S63642A Sprain of metacarpophalangeal joint of left thumb, initial encounter: Secondary | ICD-10-CM

## 2018-03-21 LAB — POCT GLYCOSYLATED HEMOGLOBIN (HGB A1C): HbA1c, POC (controlled diabetic range): 7.5 % — AB (ref 0.0–7.0)

## 2018-03-21 MED ORDER — KETOCONAZOLE 2 % EX CREA: 1.0000 "application " | TOPICAL_CREAM | Freq: Two times a day (BID) | CUTANEOUS | 0 refills | Status: DC

## 2018-03-21 MED ORDER — METFORMIN HCL 1000 MG PO TABS: 1000.0000 mg | ORAL_TABLET | Freq: Two times a day (BID) | ORAL | 3 refills | Status: DC

## 2018-03-21 NOTE — Progress Notes (Signed)
Subjective  CC:  Chief Complaint  Patient presents with  . Diabetes    HPI: Karl [name] is a 62 y.o. male who presents to the office today for follow up of diabetes and problems listed above in the chief complaint.   Diabetes follow up: His diabetic control is reported as Worse.  He has been eating more mainly for comfort to handle stress.  Trying to get custody of his elderly parents.  This is a family struggle.  Reviewed diet, eats mainly meats and eats at restaurants.  He is hesitant to make big changes. He denies exertional CP or SOB or symptomatic hypoglycemia. He denies foot sores or paresthesias.  He declines ACE inhibitor right now, urine microalbuminuria was negative.  Blood pressure is fair.  He is on a statin for hyperlipidemia.  Pneumovax due today.  Flu shot up-to-date.  Hyperlipidemia is controlled  Second Shingrix will be due at next visit  Complains of rash in bilateral axilla.  Worse when he gets hot or sweaty.  Very red and sore.  No nodules, no fevers  Was walking the dog and dog yanked on the leash.  Thumb injury 2 days ago, was very sore and could not move it well however today's feeling much better.  Assessment  1. Uncontrolled diabetes mellitus type 2 without complications (HCC)   2. Mixed hyperlipidemia   3. Severe obesity (BMI 35.0-39.9) with comorbidity (HCC)   4. Intertrigo   5. Sprain of metacarpophalangeal (MCP) joint of left thumb, initial encounter      Plan   Diabetes is currently poorly controlled.  Now in the uncontrolled diabetic category.  Long discussion regarding goals of care and nutritional recommendations.  Patient will work on it.  Start metformin and titrate up to 1000 mill grams twice daily if tolerated.  Recheck 3 months.  Pneumovax today.  Defers ACE inhibitor for now.  Continue statin.  Hyperlipidemia on statin with normal LFTs recently.  Discussed weight loss goals  Intertrigo: Ketoconazole twice daily  Mild sprain of thumb,  supportive care.  Follow up: Return in about 3 months (around 06/21/2018) for follow up Diabetes.. Orders Placed This Encounter  Procedures  . Pneumococcal polysaccharide vaccine 23-valent greater than or equal to 2yo subcutaneous/IM  . POCT HgB A1C   Meds ordered this encounter  Medications  . metFORMIN (GLUCOPHAGE) 1000 MG tablet    Sig: Take 1 tablet (1,000 mg total) by mouth 2 (two) times daily with a meal.    Dispense:  180 tablet    Refill:  3  . ketoconazole (NIZORAL) 2 % cream    Sig: Apply 1 application topically 2 (two) times daily for 7 days. Then as needed    Dispense:  30 g    Refill:  0      Immunization History  Administered Date(s) Administered  . Influenza Inj Mdck Quad Pf 03/08/2017, 01/27/2018  . Tdap 06/01/2013  . Zoster Recombinat (Shingrix) 12/10/2017    Diabetes Related Lab Review: Lab Results  Component Value Date   HGBA1C 7.5 (A) 03/21/2018   HGBA1C 6.5 (A) 12/10/2017    Lab Results  Component Value Date   MICROALBUR 0.8 12/10/2017   Lab Results  Component Value Date   CREATININE 1.11 12/10/2017   BUN 15 12/10/2017   NA 142 12/10/2017   K 4.4 12/10/2017   CL 105 12/10/2017   CO2 29 12/10/2017   Lab Results  Component Value Date   CHOL 150 12/10/2017   CHOL  158 10/29/2017   CHOL 163 09/02/2016   Lab Results  Component Value Date   HDL 43.50 12/10/2017   HDL 41 10/29/2017   HDL 40 09/02/2016   Lab Results  Component Value Date   LDLCALC 74 12/10/2017   LDLCALC 82 10/29/2017   LDLCALC 124 09/02/2016   Lab Results  Component Value Date   TRIG 161.0 (H) 12/10/2017   TRIG 177 (A) 10/29/2017   TRIG 215 (A) 09/02/2016   Lab Results  Component Value Date   CHOLHDL 3 12/10/2017   No results found for: LDLDIRECT The 10-year ASCVD risk score Denman George DC Jr., et al., 2013) is: 14.9%   Values used to calculate the score:     Age: 80 years     Sex: Male     Is Non-Hispanic African American: No     Diabetic: Yes     Tobacco  smoker: No     Systolic Blood Pressure: 128 mmHg     Is BP treated: No     HDL Cholesterol: 43.5 mg/dL     Total Cholesterol: 150 mg/dL I have reviewed the PMH, Fam and Soc history. Patient Active Problem List   Diagnosis Date Noted  . Uncontrolled diabetes mellitus type 2 without complications (HCC) 12/10/2017    Priority: High    a1c 6.05 Jun 2016 and July 2019; neg urine microalbuminuria 11/2017 - no ACE yet.   . Mixed hyperlipidemia 09/16/2017    Priority: High  . Severe obesity (BMI 35.0-39.9) with comorbidity (HCC) 09/16/2017    Priority: High  . Lumbar herniated disc 09/16/2017    Priority: Medium    L3,4,5 by MRI, NeuroSurgery   . Left lumbar radiculopathy 09/16/2017    Priority: Medium    Evaluated by NS and treated with PT, [place].    . Osteoarthritis, hand 09/16/2017    Priority: Medium  . Recurrent sinusitis 12/16/2015    Priority: Medium  . Degenerative tear of left medial meniscus 12/10/2017    Priority: Low    Dr. Despina Hick   . Heat rash 09/16/2017    Priority: Low    Recurring;    . Non-seasonal allergic rhinitis 12/16/2015    Priority: Low    Social History: Patient  reports that he has quit smoking. He has never used smokeless tobacco. He reports that he drank alcohol. He reports that he does not use drugs.  Review of Systems: Ophthalmic: negative for eye pain, loss of vision or double vision Cardiovascular: negative for chest pain Respiratory: negative for SOB or persistent cough Gastrointestinal: negative for abdominal pain Genitourinary: negative for dysuria or gross hematuria MSK: negative for foot lesions Neurologic: negative for weakness or gait disturbance  Objective  Vitals: BP 128/82   Pulse 75   Temp 98.7 F (37.1 C) (Oral)   Resp 18   Ht 5' 7.5" (1.715 m)   Wt 236 lb (107 kg)   SpO2 98%   BMI 36.42 kg/m  General: well appearing, no acute distress  Psych:  Alert and oriented, normal mood and affect Skin:  Warm, bilateral  axilla with fiery red rash, no fluctuance or warmth  Left first MCP, nontender, no swelling, full range of motion, stable joint.   Neurologic:   Mental status is normal. normal gait    Diabetic education: ongoing education regarding chronic disease management for diabetes was given today. We continue to reinforce the ABC's of diabetic management: A1c (<7 or 8 dependent upon patient), tight blood pressure control, and cholesterol  management with goal LDL < 100 minimally. We discuss diet strategies, exercise recommendations, medication options and possible side effects. At each visit, we review recommended immunizations and preventive care recommendations for diabetics and stress that good diabetic control can prevent other problems. See below for this patient's data.    Commons side effects, risks, benefits, and alternatives for medications and treatment plan prescribed today were discussed, and the patient expressed understanding of the given instructions. Patient is instructed to call or message via MyChart if he/she has any questions or concerns regarding our treatment plan. No barriers to understanding were identified. We discussed Red Flag symptoms and signs in detail. Patient expressed understanding regarding what to do in case of urgent or emergency type symptoms.   Medication list was reconciled, printed and provided to the patient in AVS. Patient instructions and summary information was reviewed with the patient as documented in the AVS. This note was prepared with assistance of Dragon voice recognition software. Occasional wrong-word or sound-a-like substitutions may have occurred due to the inherent limitations of voice recognition software

## 2018-03-21 NOTE — Patient Instructions (Addendum)
Please return in 3 months for diabetes follow up  Look up whole food plant based diets just for information on how to get more fruits, grains and veggies in you.   If you have any questions or concerns, please don't hesitate to send me a message via MyChart or call the office at [phone]. Thank you for visiting with Karl [name] today! It's our pleasure caring for you.   Diabetes Mellitus and Nutrition When you have diabetes (diabetes mellitus), it is very important to have healthy eating habits because your blood sugar (glucose) levels are greatly affected by what you eat and drink. Eating healthy foods in the appropriate amounts, at about the same times every day, can help you:  Control your blood glucose.  Lower your risk of heart disease.  Improve your blood pressure.  Reach or maintain a healthy weight.  Every person with diabetes is different, and each person has different needs for a meal plan. Your health care provider may recommend that you work with a diet and nutrition specialist (dietitian) to make a meal plan that is best for you. Your meal plan may vary depending on factors such as:  The calories you need.  The medicines you take.  Your weight.  Your blood glucose, blood pressure, and cholesterol levels.  Your activity level.  Other health conditions you have, such as heart or kidney disease.  How do carbohydrates affect me? Carbohydrates affect your blood glucose level more than any other type of food. Eating carbohydrates naturally increases the amount of glucose in your blood. Carbohydrate counting is a method for keeping track of how many carbohydrates you eat. Counting carbohydrates is important to keep your blood glucose at a healthy level, especially if you use insulin or take certain oral diabetes medicines. It is important to know how many carbohydrates you can safely have in each meal. This is different for every person. Your dietitian can help you calculate how  many carbohydrates you should have at each meal and for snack. Foods that contain carbohydrates include:  Bread, cereal, rice, pasta, and crackers.  Potatoes and corn.  Peas, beans, and lentils.  Milk and yogurt.  Fruit and juice.  Desserts, such as cakes, cookies, ice cream, and candy.  How does alcohol affect me? Alcohol can cause a sudden decrease in blood glucose (hypoglycemia), especially if you use insulin or take certain oral diabetes medicines. Hypoglycemia can be a life-threatening condition. Symptoms of hypoglycemia (sleepiness, dizziness, and confusion) are similar to symptoms of having too much alcohol. If your health care provider says that alcohol is safe for you, follow these guidelines:  Limit alcohol intake to no more than 1 drink per day for nonpregnant women and 2 drinks per day for men. One drink equals 12 oz of beer, 5 oz of wine, or 1 oz of hard liquor.  Do not drink on an empty stomach.  Keep yourself hydrated with water, diet soda, or unsweetened iced tea.  Keep in mind that regular soda, juice, and other mixers may contain a lot of sugar and must be counted as carbohydrates.  What are tips for following this plan? Reading food labels  Start by checking the serving size on the label. The amount of calories, carbohydrates, fats, and other nutrients listed on the label are based on one serving of the food. Many foods contain more than one serving per package.  Check the total grams (g) of carbohydrates in one serving. You can calculate the number of servings of  carbohydrates in one serving by dividing the total carbohydrates by 15. For example, if a food has 30 g of total carbohydrates, it would be equal to 2 servings of carbohydrates.  Check the number of grams (g) of saturated and trans fats in one serving. Choose foods that have low or no amount of these fats.  Check the number of milligrams (mg) of sodium in one serving. Most people should limit total  sodium intake to less than 2,300 mg per day.  Always check the nutrition information of foods labeled as "low-fat" or "nonfat". These foods may be higher in added sugar or refined carbohydrates and should be avoided.  Talk to your dietitian to identify your daily goals for nutrients listed on the label. Shopping  Avoid buying canned, premade, or processed foods. These foods tend to be high in fat, sodium, and added sugar.  Shop around the outside edge of the grocery store. This includes fresh fruits and vegetables, bulk grains, fresh meats, and fresh dairy. Cooking  Use low-heat cooking methods, such as baking, instead of high-heat cooking methods like deep frying.  Cook using healthy oils, such as olive, canola, or sunflower oil.  Avoid cooking with butter, cream, or high-fat meats. Meal planning  Eat meals and snacks regularly, preferably at the same times every day. Avoid going long periods of time without eating.  Eat foods high in fiber, such as fresh fruits, vegetables, beans, and whole grains. Talk to your dietitian about how many servings of carbohydrates you can eat at each meal.  Eat 4-6 ounces of lean protein each day, such as lean meat, chicken, fish, eggs, or tofu. 1 ounce is equal to 1 ounce of meat, chicken, or fish, 1 egg, or 1/4 cup of tofu.  Eat some foods each day that contain healthy fats, such as avocado, nuts, seeds, and fish. Lifestyle   Check your blood glucose regularly.  Exercise at least 30 minutes 5 or more days each week, or as told by your health care provider.  Take medicines as told by your health care provider.  Do not use any products that contain nicotine or tobacco, such as cigarettes and e-cigarettes. If you need help quitting, ask your health care provider.  Work with a Social worker or diabetes educator to identify strategies to manage stress and any emotional and social challenges. What are some questions to ask my health care provider?  Do  I need to meet with a diabetes educator?  Do I need to meet with a dietitian?  What number can I call if I have questions?  When are the best times to check my blood glucose? Where to find more information:  American Diabetes Association: diabetes.org/food-and-fitness/food  Academy of Nutrition and Dietetics: PokerClues.dk  [employer] of Diabetes and Digestive and Kidney Diseases (NIH): ContactWire.be Summary  A healthy meal plan will help you control your blood glucose and maintain a healthy lifestyle.  Working with a diet and nutrition specialist (dietitian) can help you make a meal plan that is best for you.  Keep in mind that carbohydrates and alcohol have immediate effects on your blood glucose levels. It is important to count carbohydrates and to use alcohol carefully. This information is not intended to replace advice given to you by your health care provider. Make sure you discuss any questions you have with your health care provider. Document Released: 02/12/2005 Document Revised: 06/22/2016 Document Reviewed: 06/22/2016 Elsevier Interactive Patient Education  2018 [employer].   Diabetes Mellitus and Standards  of Medical Care Managing diabetes (diabetes mellitus) can be complicated. Your diabetes treatment may be managed by a team of health care providers, including:  A diet and nutrition specialist (registered dietitian).  A nurse.  A certified diabetes educator (CDE).  A diabetes specialist (endocrinologist).  An eye doctor.  A primary care provider.  A dentist.  Your health care providers follow a schedule in order to help you get the best quality of care. The following schedule is a general guideline for your diabetes management plan. Your health care providers may also give you more specific instructions. HbA1c ( hemoglobin  A1c) test This test provides information about blood sugar (glucose) control over the previous 2-3 months. It is used to check whether your diabetes management plan needs to be adjusted.  If you are meeting your treatment goals, this test is done at least 2 times a year.  If you are not meeting treatment goals or if your treatment goals have changed, this test is done 4 times a year.  Blood pressure test  This test is done at every routine medical visit. For most people, the goal is less than 130/80. Ask your health care provider what your goal blood pressure should be. Dental and eye exams  Visit your dentist two times a year.  If you have type 1 diabetes, get an eye exam 3-5 years after you are diagnosed, and then once a year after your first exam. ? If you were diagnosed with type 1 diabetes as a child, get an eye exam when you are age 28 or older and have had diabetes for 3-5 years. After the first exam, you should get an eye exam once a year.  If you have type 2 diabetes, have an eye exam as soon as you are diagnosed, and then once a year after your first exam. Foot care exam  Visual foot exams are done at every routine medical visit. The exams check for cuts, bruises, redness, blisters, sores, or other problems with the feet.  A complete foot exam is done by your health care provider once a year. This exam includes an inspection of the structure and skin of your feet, and a check of the pulses and sensation in your feet. ? Type 1 diabetes: Get your first exam 3-5 years after diagnosis. ? Type 2 diabetes: Get your first exam as soon as you are diagnosed.  Check your feet every day for cuts, bruises, redness, blisters, or sores. If you have any of these or other problems that are not healing, contact your health care provider. Kidney function test ( urine microalbumin)  This test is done once a year. ? Type 1 diabetes: Get your first test 5 years after diagnosis. ? Type 2  diabetes: Get your first test as soon as you are diagnosed.  If you have chronic kidney disease (CKD), get a serum creatinine and estimated glomerular filtration rate (eGFR) test once a year. Lipid profile (cholesterol, HDL, LDL, triglycerides)  This test should be done when you are diagnosed with diabetes, and every 5 years after the first test. If you are on medicines to lower your cholesterol, you may need to get this test done every year. ? The goal for LDL is less than 100 mg/dL (5.5 mmol/L). If you are at high risk, the goal is less than 70 mg/dL (3.9 mmol/L). ? The goal for HDL is 40 mg/dL (2.2 mmol/L) for men and 50 mg/dL(2.8 mmol/L) for women. An HDL cholesterol of 60  mg/dL (3.3 mmol/L) or higher gives some protection against heart disease. ? The goal for triglycerides is less than 150 mg/dL (8.3 mmol/L). Immunizations  The yearly flu (influenza) vaccine is recommended for everyone 6 months or older who has diabetes.  The pneumonia (pneumococcal) vaccine is recommended for everyone 2 years or older who has diabetes. If you are 63 or older, you may get the pneumonia vaccine as a series of two separate shots.  The hepatitis B vaccine is recommended for adults shortly after they have been diagnosed with diabetes.  The Tdap (tetanus, diphtheria, and pertussis) vaccine should be given: ? According to normal childhood vaccination schedules, for children. ? Every 10 years, for adults who have diabetes.  The shingles vaccine is recommended for people who have had chicken pox and are 50 years or older. Mental and emotional health  Screening for symptoms of eating disorders, anxiety, and depression is recommended at the time of diagnosis and afterward as needed. If your screening shows that you have symptoms (you have a positive screening result), you may need further evaluation and be referred to a mental health care provider. Diabetes self-management education  Education about how to  manage your diabetes is recommended at diagnosis and ongoing as needed. Treatment plan  Your treatment plan will be reviewed at every medical visit. Summary  Managing diabetes (diabetes mellitus) can be complicated. Your diabetes treatment may be managed by a team of health care providers.  Your health care providers follow a schedule in order to help you get the best quality of care.  Standards of care including having regular physical exams, blood tests, blood pressure monitoring, immunizations, screening tests, and education about how to manage your diabetes.  Your health care providers may also give you more specific instructions based on your individual health. This information is not intended to replace advice given to you by your health care provider. Make sure you discuss any questions you have with your health care provider. Document Released: 03/15/2009 Document Revised: 02/14/2016 Document Reviewed: 02/14/2016 Elsevier Interactive Patient Education  [employer].

## 2018-06-09 ENCOUNTER — Ambulatory Visit: Payer: 59 | Admitting: Family Medicine

## 2018-06-09 ENCOUNTER — Encounter: Payer: Self-pay | Admitting: Family Medicine

## 2018-06-09 VITALS — BP 132/76 | HR 97 | Temp 98.4°F | Resp 18 | Ht 67.5 in | Wt 240.8 lb

## 2018-06-09 DIAGNOSIS — J208 Acute bronchitis due to other specified organisms: Secondary | ICD-10-CM

## 2018-06-09 DIAGNOSIS — B9689 Other specified bacterial agents as the cause of diseases classified elsewhere: Secondary | ICD-10-CM

## 2018-06-09 MED ORDER — AZITHROMYCIN 250 MG PO TABS: ORAL_TABLET | ORAL | 0 refills | Status: DC

## 2018-06-09 NOTE — Progress Notes (Signed)
Subjective  CC:  Chief Complaint  Patient presents with  . URI    Started 10 - 12 days ago. Has tried Nitequil.Marland Kitchen Recently had to do some flying    HPI: SUBJECTIVE:  Karl [name] is a 63 y.o. male who complains of congestion, nasal blockage, post nasal drip, cough described as productive and denies sinus, high fevers, SOB, chest pain or significant GI symptoms. Symptoms have been present for almost 2 weeks. Doing a little better but still with cough and drainage and mild malaise. He denies a history of anorexia, dizziness, vomiting and wheezing. He denies a history of asthma or COPD. Patient does not smoke cigarettes.  Assessment  1. Acute bacterial bronchitis      Plan  Discussion:  Treat for bacterial bronchitis due to prolonged course and worsening symptoms. Education regarding differences between viral and bacterial infections and treatment options are discussed.  Supportive care measures are recommended.  We discussed the use of mucolytic's, decongestants, antihistamines and antitussives as needed.  Tylenol or Advil are recommended if needed.  Follow up: Return for as scheduled.   No orders of the defined types were placed in this encounter.  Meds ordered this encounter  Medications  . azithromycin (ZITHROMAX) 250 MG tablet    Sig: Take 2 tabs today, then 1 tab daily for 4 days    Dispense:  1 each    Refill:  0      I reviewed the patients updated PMH, FH, and SocHx.  Social History: Patient  reports that he has quit smoking. He has never used smokeless tobacco. He reports previous alcohol use. He reports that he does not use drugs.  Patient Active Problem List   Diagnosis Date Noted  . Uncontrolled diabetes mellitus type 2 without complications (HCC) 12/10/2017    Priority: High  . Mixed hyperlipidemia 09/16/2017    Priority: High  . Severe obesity (BMI 35.0-39.9) with comorbidity (HCC) 09/16/2017    Priority: High  . Lumbar herniated disc 09/16/2017   Priority: Medium  . Left lumbar radiculopathy 09/16/2017    Priority: Medium  . Osteoarthritis, hand 09/16/2017    Priority: Medium  . Recurrent sinusitis 12/16/2015    Priority: Medium  . Degenerative tear of left medial meniscus 12/10/2017    Priority: Low  . Heat rash 09/16/2017    Priority: Low  . Non-seasonal allergic rhinitis 12/16/2015    Priority: Low    Review of Systems: Cardiovascular: negative for chest pain Respiratory: negative for SOB or hemoptysis Gastrointestinal: negative for abdominal pain Genitourinary: negative for dysuria or gross hematuria Current Meds  Medication Sig  . augmented betamethasone dipropionate (DIPROLENE-AF) 0.05 % cream APPLY A THIN FILM EXTERNALLY TO AFFECTED AREA. APPLY TWICE DAILY TO AFFECTED AREA ON HAND.  Marland Kitchen Coenzyme Q10 (CO Q 10) 10 MG CAPS Take by mouth.  . fenofibrate 160 MG tablet TAKE ONE TABLET BY MOUTH ONE TIME DAILY 90 DAYS  . fluticasone (FLONASE) 50 MCG/ACT nasal spray Place into the nose.  . metFORMIN (GLUCOPHAGE) 1000 MG tablet Take 1 tablet (1,000 mg total) by mouth 2 (two) times daily with a meal.  . Multiple Vitamins-Minerals (CENTRUM SILVER 50+MEN PO) Take by mouth.  . Multiple Vitamins-Minerals (OCUVITE ADULT 50+ PO) Take by mouth.  . Naproxen Sodium (ALEVE) 220 MG CAPS Take by mouth.  . Omega 3-6-9 Fatty Acids (OMEGA 3-6-9 COMPLEX PO) Take by mouth.  . simvastatin (ZOCOR) 80 MG tablet TAKE 1/2 TABLET IN THE EVENING ONCE A DAY ORALLY  30 DAYS    Objective  Vitals: BP 132/76   Pulse 97   Temp 98.4 F (36.9 C) (Oral)   Resp 18   Ht 5' 7.5" (1.715 m)   Wt 240 lb 12.8 oz (109.2 kg)   SpO2 97%   BMI 37.16 kg/m  General: no acute distress  Psych:  Alert and oriented, normal mood and affect HEENT:  Normocephalic, atraumatic, supple neck, moist mucous membranes, mildly erythematous pharynx without exudate, mild lymphadenopathy, supple neck Cardiovascular:  RRR without murmur. no edema Respiratory:  Good breath sounds  bilaterally, CTAB with normal respiratory effort  Skin:  Warm, no rashes Neurologic:   Mental status is normal. normal gait  Commons side effects, risks, benefits, and alternatives for medications and treatment plan prescribed today were discussed, and the patient expressed understanding of the given instructions. Patient is instructed to call or message via MyChart if he/she has any questions or concerns regarding our treatment plan. No barriers to understanding were identified. We discussed Red Flag symptoms and signs in detail. Patient expressed understanding regarding what to do in case of urgent or emergency type symptoms.  Medication list was reconciled, printed and provided to the patient in AVS. Patient instructions and summary information was reviewed with the patient as documented in the AVS. This note was prepared with assistance of Dragon voice recognition software. Occasional wrong-word or sound-a-like substitutions may have occurred due to the inherent limitations of voice recognition software

## 2018-06-09 NOTE — Patient Instructions (Addendum)
Follow up as scheduled.  06/22/2018   You may use Delsym cough syrup or Mucinex DM to help with congestion and coughing.   Acute Bronchitis, Adult  Acute bronchitis is sudden (acute) swelling of the air tubes (bronchi) in the lungs. Acute bronchitis causes these tubes to fill with mucus, which can make it hard to breathe. It can also cause coughing or wheezing. In adults, acute bronchitis usually goes away within 2 weeks. A cough caused by bronchitis may last up to 3 weeks. Smoking, allergies, and asthma can make the condition worse. Repeated episodes of bronchitis may cause further lung problems, such as chronic obstructive pulmonary disease (COPD). What are the causes? This condition can be caused by germs and by substances that irritate the lungs, including:  Cold and flu viruses. This condition is most often caused by the same virus that causes a cold.  Bacteria.  Exposure to tobacco smoke, dust, fumes, and air pollution. What increases the risk? This condition is more likely to develop in people who:  Have close contact with someone with acute bronchitis.  Are exposed to lung irritants, such as tobacco smoke, dust, fumes, and vapors.  Have a weak immune system.  Have a respiratory condition such as asthma. What are the signs or symptoms? Symptoms of this condition include:  A cough.  Coughing up clear, yellow, or green mucus.  Wheezing.  Chest congestion.  Shortness of breath.  A fever.  Body aches.  Chills.  A sore throat. How is this diagnosed? This condition is usually diagnosed with a physical exam. During the exam, your health care provider may order tests, such as chest X-rays, to rule out other conditions. He or she may also:  Test a sample of your mucus for bacterial infection.  Check the level of oxygen in your blood. This is done to check for pneumonia.  Do a chest X-ray or lung function testing to rule out pneumonia and other  conditions.  Perform blood tests. Your health care provider will also ask about your symptoms and medical history. How is this treated? Most cases of acute bronchitis clear up over time without treatment. Your health care provider may recommend:  Drinking more fluids. Drinking more makes your mucus thinner, which may make it easier to breathe.  Taking a medicine for a fever or cough.  Taking an antibiotic medicine.  Using an inhaler to help improve shortness of breath and to control a cough.  Using a cool mist vaporizer or humidifier to make it easier to breathe. Follow these instructions at home: Medicines  Take over-the-counter and prescription medicines only as told by your health care provider.  If you were prescribed an antibiotic, take it as told by your health care provider. Do not stop taking the antibiotic even if you start to feel better. General instructions   Get plenty of rest.  Drink enough fluids to keep your urine pale yellow.  Avoid smoking and secondhand smoke. Exposure to cigarette smoke or irritating chemicals will make bronchitis worse. If you smoke and you need help quitting, ask your health care provider. Quitting smoking will help your lungs heal faster.  Use an inhaler, cool mist vaporizer, or humidifier as told by your health care provider.  Keep all follow-up visits as told by your health care provider. This is important. How is this prevented? To lower your risk of getting this condition again:  Wash your hands often with soap and water. If soap and water are not available, use  hand sanitizer.  Avoid contact with people who have cold symptoms.  Try not to touch your hands to your mouth, nose, or eyes.  Make sure to get the flu shot every year. Contact a health care provider if:  Your symptoms do not improve in 2 weeks of treatment. Get help right away if:  You cough up blood.  You have chest pain.  You have severe shortness of  breath.  You become dehydrated.  You faint or keep feeling like you are going to faint.  You keep vomiting.  You have a severe headache.  Your fever or chills gets worse. This information is not intended to replace advice given to you by your health care provider. Make sure you discuss any questions you have with your health care provider. Document Released: 06/25/2004 Document Revised: 12/30/2016 Document Reviewed: 11/06/2015 Elsevier Interactive Patient Education  2019 [employer].

## 2018-06-22 ENCOUNTER — Ambulatory Visit: Payer: 59 | Admitting: Family Medicine

## 2018-06-22 ENCOUNTER — Encounter: Payer: Self-pay | Admitting: Family Medicine

## 2018-06-22 ENCOUNTER — Other Ambulatory Visit: Payer: Self-pay

## 2018-06-22 VITALS — BP 138/76 | HR 92 | Temp 98.5°F | Resp 16 | Ht 68.0 in | Wt 231.8 lb

## 2018-06-22 DIAGNOSIS — E782 Mixed hyperlipidemia: Secondary | ICD-10-CM

## 2018-06-22 DIAGNOSIS — J3089 Other allergic rhinitis: Secondary | ICD-10-CM

## 2018-06-22 DIAGNOSIS — E1165 Type 2 diabetes mellitus with hyperglycemia: Secondary | ICD-10-CM | POA: Diagnosis not present

## 2018-06-22 DIAGNOSIS — Z23 Encounter for immunization: Secondary | ICD-10-CM

## 2018-06-22 DIAGNOSIS — IMO0001 Reserved for inherently not codable concepts without codable children: Secondary | ICD-10-CM

## 2018-06-22 LAB — POCT GLYCOSYLATED HEMOGLOBIN (HGB A1C): Hemoglobin A1C: 6 % — AB (ref 4.0–5.6)

## 2018-06-22 MED ORDER — MONTELUKAST SODIUM 10 MG PO TABS: 10.0000 mg | ORAL_TABLET | Freq: Every day | ORAL | 3 refills | Status: AC

## 2018-06-22 NOTE — Progress Notes (Signed)
Subjective  CC:  Chief Complaint  Patient presents with  . Diabetes    Is taking Metformin 161m BID and this morning CBG was 114.  . Bronchitis    Completed Z-Pak, states that he is still having drainage and cough    HPI: TKRYSTIAN YOUNGLOVEis a 63y.o. male who presents to the office today for follow up of diabetes and problems listed above in the chief complaint.   Diabetes follow up: His diabetic control is reported as Improved. Is eating much better. Is taking metformin 1000 bid and tolerating it. Down 9 pounds!  He denies exertional CP or SOB or symptomatic hypoglycemia. He denies foot sores or paresthesias. Defers Ace. Neg urine MAC ratio.   Treated for sinustis a few weeks ago. Reports sinus sxs improved; no longer with thick drainage, pain, or malaise. But has rhinorrhea, ear pressure/popping, PND and cough. Has h/o severe asthma and allergies as an infant child; required tracheostomy and immunotherapy. On oral antihistamine and intermittent flonase now. No wheezing or sob. No f/c/s. Feels physically well.   Lipids are at goal; tolerating statin.   Wt Readings from Last 3 Encounters:  06/22/18 231 lb 12.8 oz (105.1 kg)  06/09/18 240 lb 12.8 oz (109.2 kg)  03/21/18 236 lb (107 kg)    BP Readings from Last 3 Encounters:  06/22/18 138/76  06/09/18 132/76  03/21/18 128/82    Assessment  1. Uncontrolled diabetes mellitus type 2 without complications ([place]   2. Severe obesity (BMI 35.0-39.9) with comorbidity ([facility]   3. Mixed hyperlipidemia   4. Chronic nonseasonal allergic rhinitis due to pollen      Plan   Diabetes is currently very well controlled. Praised for making appropriate lifestyle changes. Continue meds and diet as is. Eye exam updated: no retinopathy. Monitor urine annually. No ACE as of yet. Monitoring bp closely. Borderline: low threshold to start ace. Pt declines.   Obesity: improving!  HLD is controlled.   Chronic allergies: increase flonase to daily.  If needed, start singulair  Updated #2 shingrix today. Now imms are all up to date.  Follow up: Return in about 3 months (around 09/21/2018) for follow up Diabetes.. Orders Placed This Encounter  Procedures  . POCT glycosylated hemoglobin (Hb A1C)   Meds ordered this encounter  Medications  . montelukast (SINGULAIR) 10 MG tablet    Sig: Take 1 tablet (10 mg total) by mouth at bedtime.    Dispense:  30 tablet    Refill:  3      Immunization History  Administered Date(s) Administered  . Influenza Inj Mdck Quad Pf 03/08/2017, 01/27/2018  . Pneumococcal Polysaccharide-23 03/21/2018  . Tdap 06/01/2013  . Zoster Recombinat (Shingrix) 12/10/2017    Diabetes Related Lab Review: Lab Results  Component Value Date   HGBA1C 6.0 (A) 06/22/2018   HGBA1C 7.5 (A) 03/21/2018   HGBA1C 6.5 (A) 12/10/2017    Lab Results  Component Value Date   MICROALBUR 0.8 12/10/2017   Lab Results  Component Value Date   CREATININE 1.11 12/10/2017   BUN 15 12/10/2017   NA 142 12/10/2017   K 4.4 12/10/2017   CL 105 12/10/2017   CO2 29 12/10/2017   Lab Results  Component Value Date   CHOL 150 12/10/2017   CHOL 158 10/29/2017   CHOL 163 09/02/2016   Lab Results  Component Value Date   HDL 43.50 12/10/2017   HDL 41 10/29/2017   HDL 40 09/02/2016   Lab Results  Component Value Date   LDLCALC 74 12/10/2017   LDLCALC 82 10/29/2017   LDLCALC 124 09/02/2016   Lab Results  Component Value Date   TRIG 161.0 (H) 12/10/2017   TRIG 177 (A) 10/29/2017   TRIG 215 (A) 09/02/2016   Lab Results  Component Value Date   CHOLHDL 3 12/10/2017   No results found for: LDLDIRECT The 10-year ASCVD risk score Mikey Bussing DC Jr., et al., 2013) is: 16.9%   Values used to calculate the score:     Age: 63 years     Sex: Male     Is Non-Hispanic African American: No     Diabetic: Yes     Tobacco smoker: No     Systolic Blood Pressure: 701 mmHg     Is BP treated: No     HDL Cholesterol: 43.5 mg/dL     Total  Cholesterol: 150 mg/dL I have reviewed the [place], Fam and Soc history. Patient Active Problem List   Diagnosis Date Noted  . Uncontrolled diabetes mellitus type 2 without complications ([place]) 77/93/9030    Priority: High    a1c 6.05 Jun 2016 and July 2019; neg urine microalbuminuria 11/2017 - no ACE yet.   . Mixed hyperlipidemia 09/16/2017    Priority: High  . Severe obesity (BMI 35.0-39.9) with comorbidity ([place]) 09/16/2017    Priority: High  . Lumbar herniated disc 09/16/2017    Priority: Medium    L3,4,5 by MRI, NeuroSurgery   . Left lumbar radiculopathy 09/16/2017    Priority: Medium    Evaluated by NS and treated with PT, [place].    . Osteoarthritis, hand 09/16/2017    Priority: Medium  . Recurrent sinusitis 12/16/2015    Priority: Medium  . Degenerative tear of left medial meniscus 12/10/2017    Priority: Low    Dr. Maureen Ralphs   . Heat rash 09/16/2017    Priority: Low    Recurring;    . Non-seasonal allergic rhinitis 12/16/2015    Priority: Low    Social History: Patient  reports that he has quit smoking. He has never used smokeless tobacco. He reports previous alcohol use. He reports that he does not use drugs.  Review of Systems: Ophthalmic: negative for eye pain, loss of vision or double vision Cardiovascular: negative for chest pain Respiratory: negative for SOB or persistent cough Gastrointestinal: negative for abdominal pain Genitourinary: negative for dysuria or gross hematuria MSK: negative for foot lesions Neurologic: negative for weakness or gait disturbance  Objective  Vitals: BP 138/76   Pulse 92   Temp 98.5 F (36.9 C) (Oral)   Resp 16   Ht _0  (1.727 m)   Wt 231 lb 12.8 oz (105.1 kg)   SpO2 98%   BMI 35.25 kg/m  General: well appearing, no acute distress  Psych:  Alert and oriented, normal mood and affect HEENT:  Normocephalic, atraumatic, moist mucous membranes, supple neck, nasal mucosa is inflamed, TMs are ok.  No LAD Cardiovascular:   Nl S1 and S2, RRR without murmur, gallop or rub. no edema Respiratory:  Good breath sounds bilaterally, CTAB with normal effort, no rales     Diabetic education: ongoing education regarding chronic disease management for diabetes was given today. We continue to reinforce the ABC's of diabetic management: A1c (<7 or 8 dependent upon patient), tight blood pressure control, and cholesterol management with goal LDL < 100 minimally. We discuss diet strategies, exercise recommendations, medication options and possible side effects. At each visit, we review recommended  immunizations and preventive care recommendations for diabetics and stress that good diabetic control can prevent other problems. See below for this patient's data.    Commons side effects, risks, benefits, and alternatives for medications and treatment plan prescribed today were discussed, and the patient expressed understanding of the given instructions. Patient is instructed to call or message via MyChart if he/she has any questions or concerns regarding our treatment plan. No barriers to understanding were identified. We discussed Red Flag symptoms and signs in detail. Patient expressed understanding regarding what to do in case of urgent or emergency type symptoms.   Medication list was reconciled, printed and provided to the patient in AVS. Patient instructions and summary information was reviewed with the patient as documented in the AVS. This note was prepared with assistance of Dragon voice recognition software. Occasional wrong-word or sound-a-like substitutions may have occurred due to the inherent limitations of voice recognition software

## 2018-06-22 NOTE — Patient Instructions (Addendum)
Please return in 3 months for diabetes follow up  Your diabetes now looks great so keep it up!!  Start flonase daily. Add singulair if needed.  If you have any questions or concerns, please don't hesitate to send me a message via MyChart or call the office at [phone]. Thank you for visiting with [place] today! It's our pleasure caring for you.

## 2018-06-22 NOTE — Addendum Note (Signed)
Addended by: Erenest Blank on: 06/22/2018 01:35 PM   Modules accepted: Orders

## 2018-06-23 ENCOUNTER — Ambulatory Visit: Payer: 59 | Admitting: Family Medicine

## 2018-09-21 ENCOUNTER — Ambulatory Visit: Payer: 59 | Admitting: Family Medicine

## 2018-09-28 ENCOUNTER — Other Ambulatory Visit: Payer: Self-pay

## 2018-09-28 ENCOUNTER — Encounter: Payer: Self-pay | Admitting: Family Medicine

## 2018-09-28 ENCOUNTER — Ambulatory Visit (INDEPENDENT_AMBULATORY_CARE_PROVIDER_SITE_OTHER): Payer: 59 | Admitting: Family Medicine

## 2018-09-28 VITALS — Wt 227.0 lb

## 2018-09-28 DIAGNOSIS — E782 Mixed hyperlipidemia: Secondary | ICD-10-CM | POA: Diagnosis not present

## 2018-09-28 DIAGNOSIS — E119 Type 2 diabetes mellitus without complications: Secondary | ICD-10-CM | POA: Diagnosis not present

## 2018-09-28 NOTE — Assessment & Plan Note (Addendum)
Well controlled clinically. Working on weight loss and diabetic diet. Continue current medications. If loses enough weight, may be able to stop metformin in future. Will check a1c in 3 months after pandemic restrictions lifted. (last 6.0)

## 2018-09-28 NOTE — Progress Notes (Signed)
Virtual Visit via Video Note  Subjective  CC:  Chief Complaint  Patient presents with   Diabetes     I connected with Karl [name] on 09/28/18 at 10:40 AM EDT by a video enabled telemedicine application and verified that I am speaking with the correct person using two identifiers. Location patient: Home Location provider: [facility] Persons participating in the virtual visit: Karl [name], Willow Ora, MD Rita Ohara, CMA  I discussed the limitations of evaluation and management by telemedicine and the availability of in person appointments. The patient expressed understanding and agreed to proceed. HPI: Karl [name] is a 63 y.o. male who was contacted today to address the problems listed above in the chief complaint, f/u diabetes:  Diabetes follow up: His diabetic control is reported as Unchanged. fastings are 99-118. Weight is stable: weighed in yesterday at 227: lost about 2-3 pounds. No problems with medications.   He denies exertional CP or SOB or symptomatic hypoglycemia. He denies foot sores or paresthesias.  Obesity: on nutrisystem diet and working on goal of reaching 180 pounds in about 6 months. Eating healthier and less.   HLD on statin and no AEs  HM due in July.   Immunization History  Administered Date(s) Administered   Influenza Inj Mdck Quad Pf 03/08/2017, 01/27/2018   Pneumococcal Polysaccharide-23 03/21/2018   Tdap 06/01/2013   Zoster Recombinat (Shingrix) 12/10/2017, 06/22/2018    Diabetes Related Lab Review: Lab Results  Component Value Date   HGBA1C 6.0 (A) 06/22/2018   HGBA1C 7.5 (A) 03/21/2018   HGBA1C 6.5 (A) 12/10/2017    Lab Results  Component Value Date   MICROALBUR 0.8 12/10/2017   Lab Results  Component Value Date   CREATININE 1.11 12/10/2017   BUN 15 12/10/2017   NA 142 12/10/2017   K 4.4 12/10/2017   CL 105 12/10/2017   CO2 29 12/10/2017   Lab Results  Component Value Date   CHOL 150 12/10/2017   CHOL 158 10/29/2017   CHOL 163 09/02/2016   Lab Results  Component Value Date   HDL 43.50 12/10/2017   HDL 41 10/29/2017   HDL 40 09/02/2016   Lab Results  Component Value Date   LDLCALC 74 12/10/2017   LDLCALC 82 10/29/2017   LDLCALC 124 09/02/2016   Lab Results  Component Value Date   TRIG 161.0 (H) 12/10/2017   TRIG 177 (A) 10/29/2017   TRIG 215 (A) 09/02/2016   Lab Results  Component Value Date   CHOLHDL 3 12/10/2017   No results found for: LDLDIRECT The 10-year ASCVD risk score Denman George DC Jr., et al., 2013) is: 16.9%   Values used to calculate the score:     Age: 55 years     Sex: Male     Is Non-Hispanic African American: No     Diabetic: Yes     Tobacco smoker: No     Systolic Blood Pressure: 138 mmHg     Is BP treated: No     HDL Cholesterol: 43.5 mg/dL     Total Cholesterol: 150 mg/dL  BP Readings from Last 3 Encounters:  06/22/18 138/76  06/09/18 132/76  03/21/18 128/82   Wt Readings from Last 3 Encounters:  09/28/18 227 lb (103 kg)  06/22/18 231 lb 12.8 oz (105.1 kg)  06/09/18 240 lb 12.8 oz (109.2 kg)    Health Maintenance  Topic Date Due   FOOT EXAM  12/11/2018  URINE MICROALBUMIN  12/11/2018   HEMOGLOBIN A1C  12/21/2018   INFLUENZA VACCINE  12/31/2018   OPHTHALMOLOGY EXAM  06/06/2019   TETANUS/TDAP  06/02/2023   COLONOSCOPY  01/31/2027   PNEUMOCOCCAL POLYSACCHARIDE VACCINE AGE 95-64 HIGH RISK  Completed   Hepatitis C Screening  Completed   HIV Screening  Completed    Assessment  1. Controlled type 2 diabetes mellitus without complication, without long-term current use of insulin (H[facility])   2. Mixed hyperlipidemia   3. Severe obesity (BMI 35.0-39.9) with comorbidity (H[facility])      Plan   See below for problem based assessment and plan documentation  Diabetic education: ongoing education regarding chronic disease management for diabetes was given today. We continue to reinforce the ABC's of diabetic  management: A1c (<7 or 8 dependent upon patient), tight blood pressure control, and cholesterol management with goal LDL < 100 minimally. We discuss diet strategies, exercise recommendations, medication options and possible side effects. At each visit, we review recommended immunizations and preventive care recommendations for diabetics and stress that good diabetic control can prevent other problems. See below for this patient's data.  I discussed the assessment and treatment plan with the patient. The patient was provided an opportunity to ask questions and all were answered. The patient agreed with the plan and demonstrated an understanding of the instructions.   The patient was advised to call back or seek an in-person evaluation if the symptoms worsen or if the condition fails to improve as anticipated. Follow up: Return in about 3 months (around 12/28/2018) for complete physical, follow up hypercholesterolemia, follow up Diabetes.  Visit date not found  No orders of the defined types were placed in this encounter.     I reviewed the patients updated PMH, FH, and SocHx.    Patient Active Problem List   Diagnosis Date Noted   Controlled type 2 diabetes mellitus without complication, without long-term current use of insulin (H[facility]) 12/10/2017    Priority: High   Mixed hyperlipidemia 09/16/2017    Priority: High   Severe obesity (BMI 35.0-39.9) with comorbidity ([facility]) 09/16/2017    Priority: High   Lumbar herniated disc 09/16/2017    Priority: Medium   Left lumbar radiculopathy 09/16/2017    Priority: Medium   Osteoarthritis, hand 09/16/2017    Priority: Medium   Recurrent sinusitis 12/16/2015    Priority: Medium   Degenerative tear of left medial meniscus 12/10/2017    Priority: Low   Heat rash 09/16/2017    Priority: Low   Non-seasonal allergic rhinitis 12/16/2015    Priority: Low   Current Meds  Medication Sig   Coenzyme Q10 (CO Q 10) 10 MG CAPS Take by mouth.    fenofibrate 160 MG tablet TAKE ONE TABLET BY MOUTH ONE TIME DAILY 90 DAYS   fluticasone (FLONASE) 50 MCG/ACT nasal spray Place 2 sprays into the nose daily.   metFORMIN (GLUCOPHAGE) 1000 MG tablet Take 1 tablet (1,000 mg total) by mouth 2 (two) times daily with a meal.   montelukast (SINGULAIR) 10 MG tablet Take 1 tablet (10 mg total) by mouth at bedtime.   Multiple Vitamins-Minerals (CENTRUM SILVER 50+MEN PO) Take by mouth.   Multiple Vitamins-Minerals (OCUVITE ADULT 50+ PO) Take by mouth.   Naproxen Sodium (ALEVE) 220 MG CAPS Take by mouth.   Omega 3-6-9 Fatty Acids (OMEGA 3-6-9 COMPLEX PO) Take by mouth.   simvastatin (ZOCOR) 80 MG tablet TAKE 1/2 TABLET IN THE EVENING ONCE A DAY ORALLY 30 DAYS  Allergies: Patient has No Known Allergies. Family History: Patient family history includes Arthritis in his father and maternal grandmother; Asthma in his mother; Cancer in his maternal grandfather, maternal grandmother, paternal grandfather, and paternal grandmother; Depression in his sister; Diabetes in his mother; Heart attack in his father; Heart disease in his father and paternal grandfather; Hyperlipidemia in his brother, father, and mother; Kidney disease in his mother; Mental illness in his sister; Miscarriages / Stillbirths in his mother; Stroke in his mother. Social History:  Patient  reports that he has quit smoking. He has never used smokeless tobacco. He reports previous alcohol use. He reports that he does not use drugs.  Review of Systems: Constitutional: Negative for fever malaise or anorexia Cardiovascular: negative for chest pain Respiratory: negative for SOB or persistent cough Gastrointestinal: negative for abdominal pain  OBJECTIVE Vitals: Wt 227 lb (103 kg)    BMI 34.52 kg/m  General: no acute distress , A&Ox3 Appears well Willow Oraamille L Chena Chohan, MD

## 2018-09-28 NOTE — Patient Instructions (Signed)
Please return in 3 months for your annual complete physical; please come fasting. And diabetes and cholesterol follow up.   If you have any questions or concerns, please don't hesitate to send me a message via MyChart or call the office at [phone]. Thank you for visiting with [place] today! It's our pleasure caring for you.

## 2018-09-28 NOTE — Assessment & Plan Note (Signed)
Counseling done. Work on [employer] changes and slow steady weight loss to avoid regaining in future. nutrisystem for now.

## 2018-09-28 NOTE — Assessment & Plan Note (Signed)
Well controlled on statin. Recheck lipids and lfts in 3 months.

## 2018-12-07 IMAGING — MR MR LUMBAR SPINE W/O CM
5 series · 31 of 48 positions shown · non-contrast
Comparison: None.

CLINICAL DATA: Lumbar radiculopathy.

EXAM:
MRI LUMBAR SPINE WITHOUT CONTRAST
TECHNIQUE: Multiplanar, multisequence MR imaging of the lumbar spine was
performed. No intravenous contrast was administered.

[Series 3: T1 · sagittal · 4.0mm · 0.88mm/px · 6 of 13 slices shown (1 of 2)]
[im 1/13]
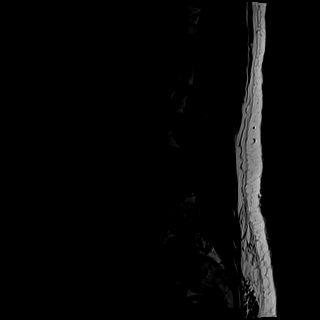
[im 3/13]
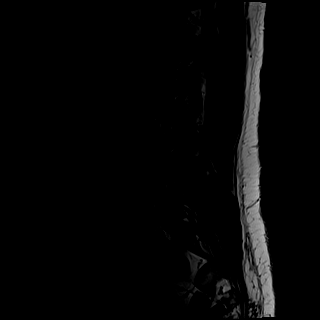
[im 5/13]
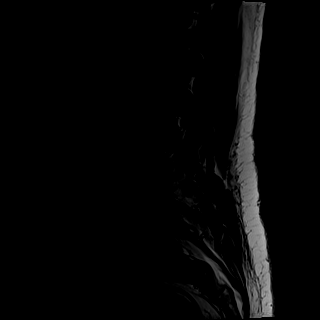
[im 8/13]
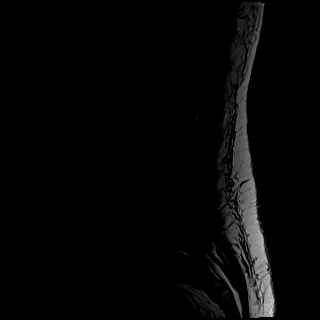
[im 10/13]
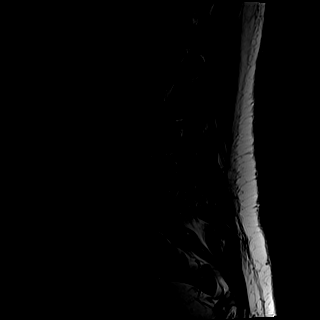
[im 13/13]
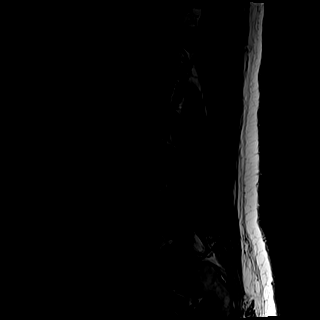

[Series 4: T1 · axial · 4.0mm · 0.37mm/px · z∈[-76,+176]mm · 9 of 34 slices shown (2 of 2)]
[im 1/34]
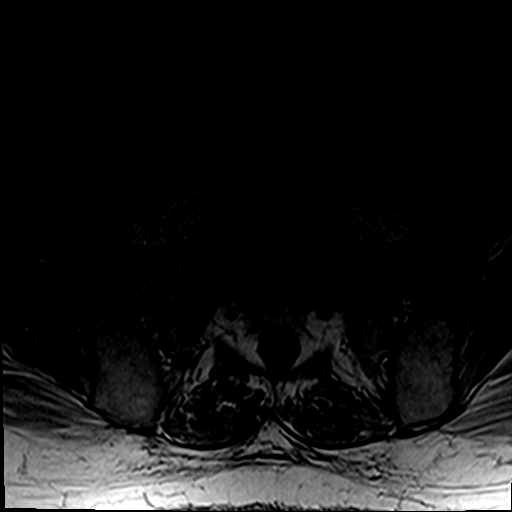
[im 5/34]
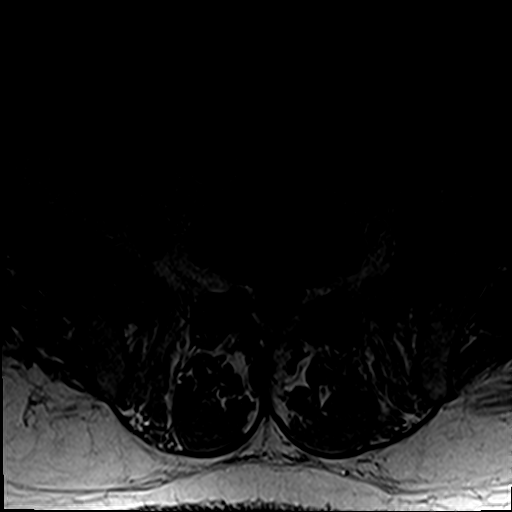
[im 10/34]
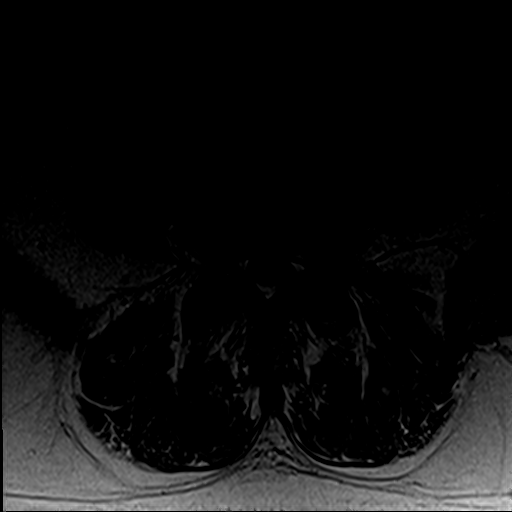
[im 15/34]
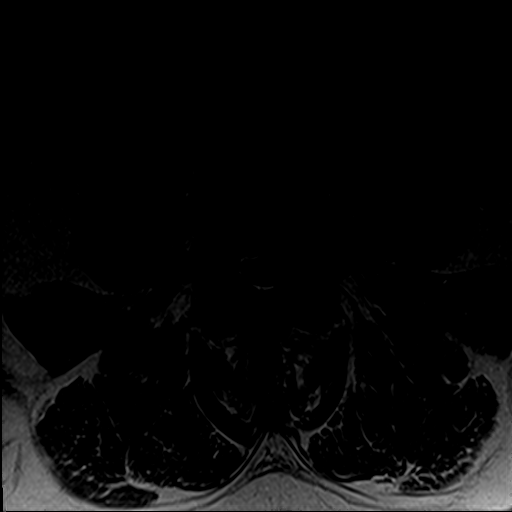
[im 17/34]
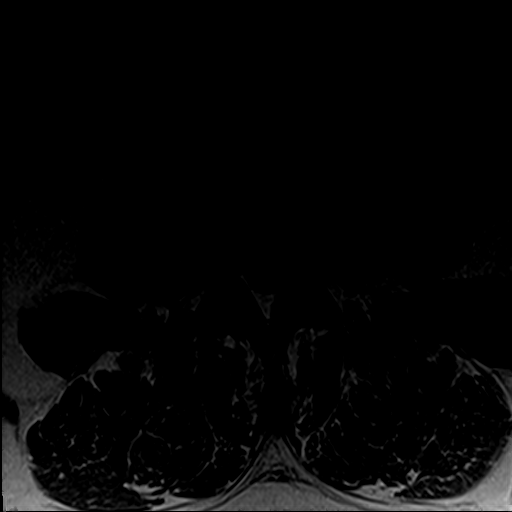
[im 19/34]
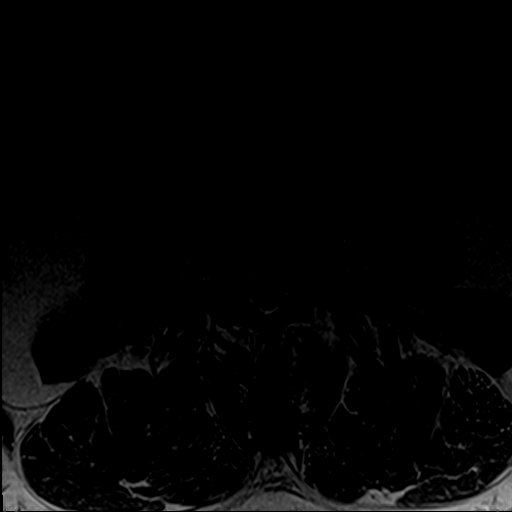
[im 24/34]
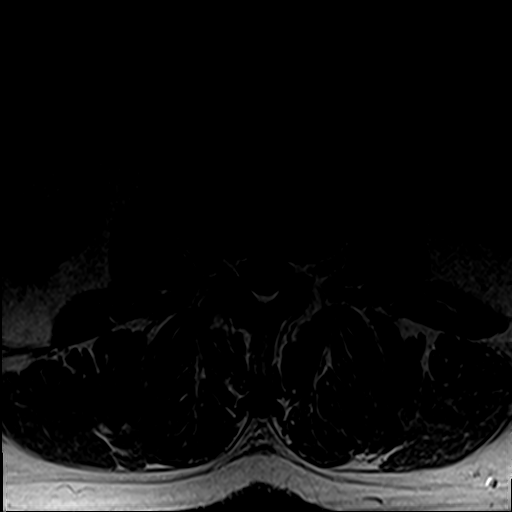
[im 29/34]
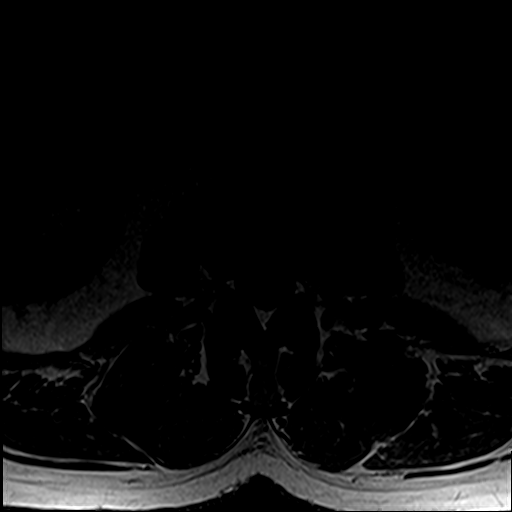
[im 34/34]
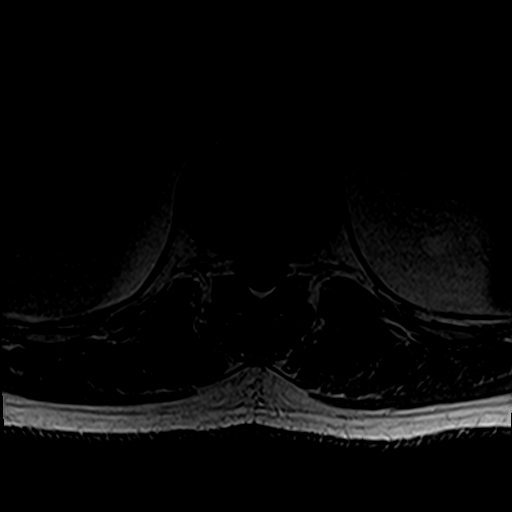

[Series 5: STIR · sagittal · 4.0mm · 0.55mm/px · 1 of 13 slices shown]
[im 1/13]
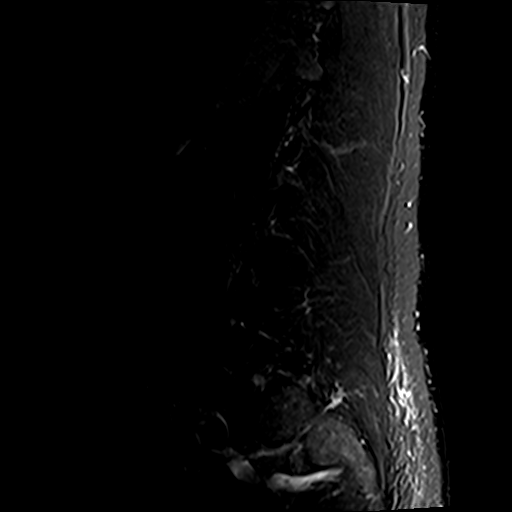

[Series 6: T2 · axial · 4.0mm · 0.74mm/px · z∈[-76,+176]mm · 9 of 34 slices shown]
[im 1/34]
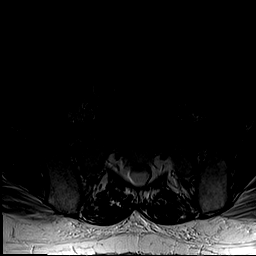
[im 5/34]
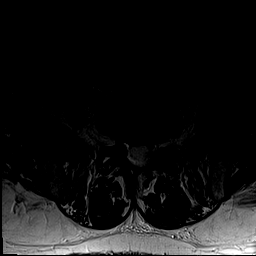
[im 10/34]
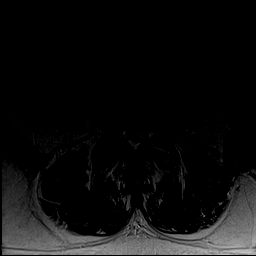
[im 15/34]
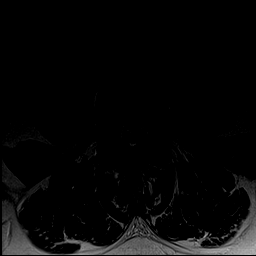
[im 17/34]
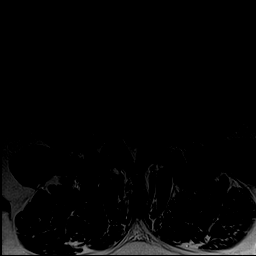
[im 19/34]
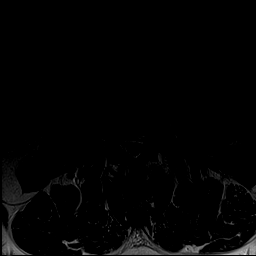
[im 24/34]
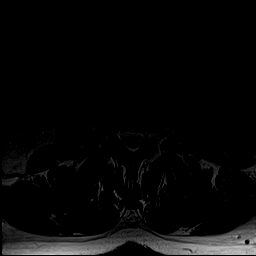
[im 29/34]
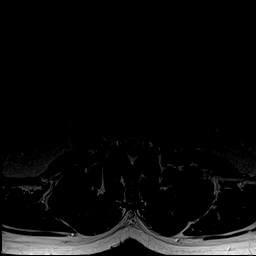
[im 34/34]
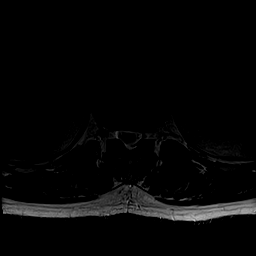

[Series 7: T2 post-contrast · sagittal · 4.0mm · 0.88mm/px · 6 of 13 slices shown]
[im 1/13]
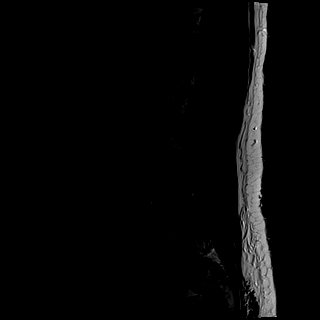
[im 3/13]
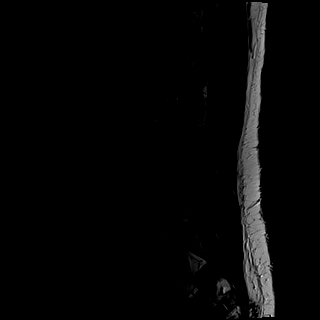
[im 5/13]
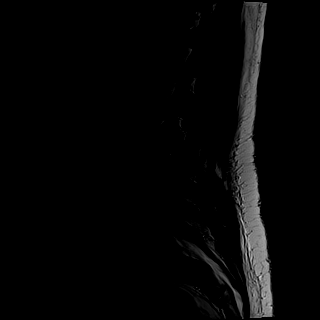
[im 8/13]
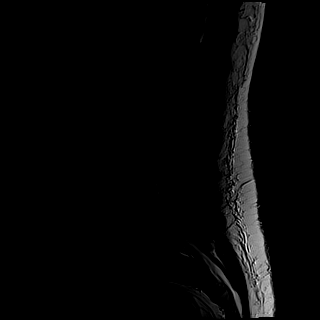
[im 10/13]
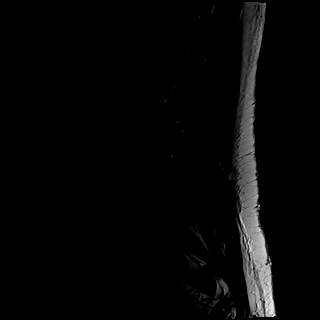
[im 13/13]
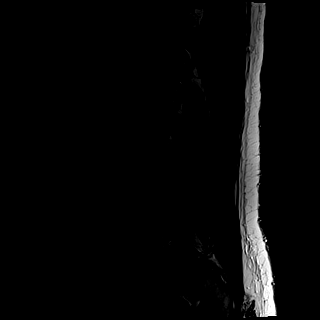

[31 of 48 positions shown; findings below may reference images not displayed]

FINDINGS: Segmentation: Normal lumbar segmentation is assumed, with the lowest
fully formed disc space designated L5-S1.

Alignment: Slight retrolisthesis of L3 on L4 and slight
anterolisthesis of L4 on L5.

Vertebrae: No evidence of fracture or suspicious osseous lesion.
Several small Schmorl's nodes are noted in the lumbar and lower
thoracic spine. The mild degenerative endplate edema is present on
the right at L2-3.

Conus medullaris: Extends to the L1 level and appears normal.

Paraspinal and other soft tissues: Unremarkable.

Disc levels:

T11-12: Mild disc space narrowing. Mild disc bulging without
stenosis.

T12-L1: Moderate disc space narrowing. Disc bulging and central disc
protrusion flatten the ventral thecal sac without significant
stenosis or spinal cord mass effect.

L1-2:  Negative.

L2-3: Mild-to-moderate disc space narrowing. Circumferential disc
bulging, moderate-sized right subarticular disc extrusion with
caudal migration to the L3 pedicle level, and mild facet and
ligamentum flavum hypertrophy result in mild spinal stenosis,
moderate right and mild-to-moderate left lateral recess stenosis,
and no significant neural foraminal stenosis. Potential right L3
nerve root impingement in the lateral recess by the disc extrusion.

L3-4: Mild disc space narrowing. Circumferential disc bulging,
moderate facet and ligamentum flavum hypertrophy, and prominent
epidural fat result in moderate spinal stenosis, mild-to-moderate
left lateral recess stenosis, and mild right and moderate left
neural foraminal stenosis.

L4-5: Mild circumferential disc bulging, severe facet and ligamentum
flavum hypertrophy, and prominent dorsal epidural fat result in
moderate spinal stenosis, mild right and mild-to-moderate left
lateral recess stenosis, and mild-to-moderate left neural foraminal
stenosis.

L5-S1: Minimal disc bulging and severe facet arthrosis without
significant stenosis. Small right facet joint effusion.
IMPRESSION: 1. L2-3 disc extrusion with right greater than left lateral recess
stenosis and potential L3 nerve root impingement.
2. Moderate multifactorial spinal stenosis at L3-4 and L4-5.
3. Severe facet arthrosis at L4-5 and L5-S1.

## 2019-01-03 ENCOUNTER — Encounter: Payer: 59 | Admitting: Family Medicine

## 2019-02-01 ENCOUNTER — Other Ambulatory Visit: Payer: Self-pay | Admitting: Family Medicine

## 2019-02-07 ENCOUNTER — Other Ambulatory Visit: Payer: Self-pay | Admitting: Family Medicine

## 2019-02-22 ENCOUNTER — Encounter: Payer: 59 | Admitting: Family Medicine

## 2019-03-29 ENCOUNTER — Other Ambulatory Visit: Payer: Self-pay | Admitting: Family Medicine

## 2019-03-29 NOTE — Telephone Encounter (Signed)
Please call patient. Overdue for DM and cpe visit. Please schedule. Refilled meds but no further refills w/o in office visit with labs. Thanks.

## 2019-04-13 ENCOUNTER — Other Ambulatory Visit: Payer: Self-pay

## 2019-04-14 ENCOUNTER — Ambulatory Visit (INDEPENDENT_AMBULATORY_CARE_PROVIDER_SITE_OTHER): Payer: 59 | Admitting: Family Medicine

## 2019-04-14 ENCOUNTER — Encounter: Payer: Self-pay | Admitting: Family Medicine

## 2019-04-14 VITALS — BP 134/86 | HR 66 | Temp 97.5°F | Ht 68.0 in | Wt 225.6 lb

## 2019-04-14 DIAGNOSIS — M5126 Other intervertebral disc displacement, lumbar region: Secondary | ICD-10-CM | POA: Diagnosis not present

## 2019-04-14 DIAGNOSIS — E119 Type 2 diabetes mellitus without complications: Secondary | ICD-10-CM

## 2019-04-14 DIAGNOSIS — E782 Mixed hyperlipidemia: Secondary | ICD-10-CM | POA: Diagnosis not present

## 2019-04-14 DIAGNOSIS — Z0000 Encounter for general adult medical examination without abnormal findings: Secondary | ICD-10-CM

## 2019-04-14 LAB — CBC WITH DIFFERENTIAL/PLATELET
Basophils Absolute: 0 10*3/uL (ref 0.0–0.1)
Basophils Relative: 0.4 % (ref 0.0–3.0)
Eosinophils Absolute: 0.3 10*3/uL (ref 0.0–0.7)
Eosinophils Relative: 4.2 % (ref 0.0–5.0)
HCT: 45.7 % (ref 39.0–52.0)
Hemoglobin: 15.6 g/dL (ref 13.0–17.0)
Lymphocytes Relative: 23.5 % (ref 12.0–46.0)
Lymphs Abs: 1.8 10*3/uL (ref 0.7–4.0)
MCHC: 34.1 g/dL (ref 30.0–36.0)
MCV: 88.3 fl (ref 78.0–100.0)
Monocytes Absolute: 0.6 10*3/uL (ref 0.1–1.0)
Monocytes Relative: 7.6 % (ref 3.0–12.0)
Neutro Abs: 5 10*3/uL (ref 1.4–7.7)
Neutrophils Relative %: 64.3 % (ref 43.0–77.0)
Platelets: 307 10*3/uL (ref 150.0–400.0)
RBC: 5.18 Mil/uL (ref 4.22–5.81)
RDW: 13.4 % (ref 11.5–15.5)
WBC: 7.7 10*3/uL (ref 4.0–10.5)

## 2019-04-14 LAB — LIPID PANEL
Cholesterol: 150 mg/dL (ref 0–200)
HDL: 40.9 mg/dL (ref >39.00)
LDL Cholesterol: 77 mg/dL (ref 0–99)
NonHDL: 109.1
Total CHOL/HDL Ratio: 4
Triglycerides: 162 mg/dL — ABNORMAL HIGH (ref 0.0–149.0)
VLDL: 32.4 mg/dL (ref 0.0–40.0)

## 2019-04-14 LAB — COMPREHENSIVE METABOLIC PANEL
ALT: 25 U/L (ref 0–53)
AST: 23 U/L (ref 0–37)
Albumin: 4.6 g/dL (ref 3.5–5.2)
Alkaline Phosphatase: 30 U/L — ABNORMAL LOW (ref 39–117)
BUN: 20 mg/dL (ref 6–23)
CO2: 30 mEq/L (ref 19–32)
Calcium: 9.9 mg/dL (ref 8.4–10.5)
Chloride: 104 mEq/L (ref 96–112)
Creatinine, Ser: 1.08 mg/dL (ref 0.40–1.50)
GFR: 69.19 mL/min (ref >60.00)
Glucose, Bld: 123 mg/dL — ABNORMAL HIGH (ref 70–99)
Potassium: 4.9 mEq/L (ref 3.5–5.1)
Sodium: 141 mEq/L (ref 135–145)
Total Bilirubin: 0.6 mg/dL (ref 0.2–1.2)
Total Protein: 7.3 g/dL (ref 6.0–8.3)

## 2019-04-14 LAB — POCT GLYCOSYLATED HEMOGLOBIN (HGB A1C): Hemoglobin A1C: 5.7 % — AB (ref 4.0–5.6)

## 2019-04-14 LAB — MICROALBUMIN / CREATININE URINE RATIO
Creatinine,U: 35.6 mg/dL
Microalb Creat Ratio: 2.1 mg/g (ref 0.0–30.0)
Microalb, Ur: 0.7 mg/dL (ref 0.0–1.9)

## 2019-04-14 NOTE — Patient Instructions (Signed)
Please return in 6 months for diabetes recheck.   I will release your lab results to you on your MyChart account with further instructions. Please reply with any questions.   If you have any questions or concerns, please don't hesitate to send me a message via MyChart or call the office at [phone]. Thank you for visiting with [place] today! It's our pleasure caring for you.  Please do these things to maintain good health!   Exercise at least 30-45 minutes a day,  4-5 days a week.   Eat a low-fat diet with lots of fruits and vegetables, up to 7-9 servings per day.  Drink plenty of water daily. Try to drink 8 8oz glasses per day.  Seatbelts can save your life. Always wear your seatbelt.  Place Smoke Detectors on every level of your home and check batteries every year.  Eye Doctor - have an eye exam every 1-2 years  Safe sex - use condoms to protect yourself from STDs if you could be exposed to these types of infections.  Avoid heavy alcohol use. If you drink, keep it to less than 2 drinks/day and not every day.  [place].  Choose someone you trust that could speak for you if you became unable to speak for yourself.  Depression is common in our stressful world.If you're feeling down or losing interest in things you normally enjoy, please come in for a visit.

## 2019-04-14 NOTE — Progress Notes (Signed)
Subjective  Chief Complaint  Patient presents with  . Annual Exam    HPI: Karl [name] is a 63 y.o. male who presents to [place] at [place] today for a Male Wellness Visit. He also has the concerns and/or needs as listed above in the chief complaint. These will be addressed in addition to the Health Maintenance Visit.   Wellness Visit: annual visit with health maintenance review and exam    Health maintenance: Fasting for lab work today..  Weight currently stable however he does report that he got down to 211 pounds on the Nutrisystem diet but had to stop due to cost.  Barriers to healthy diet include Covid restrictions.  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  We will try to make good choices.  Exercise is limited as well.  Exercise can be difficult at times due to chronic herniated lumbar disc with radicular pain at times.  He does see orthopedics for this.  Surgery has been recommended but they are holding off feels well.  No concerns.  No mood problems. Lifestyle: Body mass index is 34.3 kg/m. Wt Readings from Last 3 Encounters:  04/14/19 225 lb 9.6 oz (102.3 kg)  09/28/18 227 lb (103 kg)  06/22/18 231 lb 12.8 oz (105.1 kg)    Chronic disease management visit and/or acute problem visit:  Hyperlipidemia on fibrate, fasting for recheck today.  No side effects  Diabetes follow up: His diabetic control is reported as Improved.  He continues with the Metformin and given the weight loss he feels his sugar should be better.  No hypoglycemia noted.  He checks periodically. He denies exertional CP or SOB or symptomatic hypoglycemia. He denies foot sores or paresthesias.  Immunizations are up-to-date.  Eye exam is up-to-date.  Immunization History  Administered Date(s) Administered  . Influenza Inj Mdck Quad Pf 03/08/2017, 01/27/2018  . Pneumococcal Polysaccharide-23 03/21/2018  . Tdap 06/01/2013  . Zoster Recombinat (Shingrix)  12/10/2017, 06/22/2018    Diabetes Related Lab Review: Lab Results  Component Value Date   HGBA1C 5.7 (A) 04/14/2019   HGBA1C 6.0 (A) 06/22/2018   HGBA1C 7.5 (A) 03/21/2018    Lab Results  Component Value Date   MICROALBUR 0.8 12/10/2017   Lab Results  Component Value Date   CREATININE 1.11 12/10/2017   BUN 15 12/10/2017   NA 142 12/10/2017   K 4.4 12/10/2017   CL 105 12/10/2017   CO2 29 12/10/2017   Lab Results  Component Value Date   CHOL 150 12/10/2017   CHOL 158 10/29/2017   CHOL 163 09/02/2016   Lab Results  Component Value Date   HDL 43.50 12/10/2017   HDL 41 10/29/2017   HDL 40 09/02/2016   Lab Results  Component Value Date   LDLCALC 74 12/10/2017   LDLCALC 82 10/29/2017   LDLCALC 124 09/02/2016   Lab Results  Component Value Date   TRIG 161.0 (H) 12/10/2017   TRIG 177 (A) 10/29/2017   TRIG 215 (A) 09/02/2016   Lab Results  Component Value Date   CHOLHDL 3 12/10/2017   No results found for: LDLDIRECT The 10-year ASCVD risk score Mikey Bussing DC Jr., et al., 2013) is: 17.5%   Values used to calculate the score:     Age: 58 years     Sex: Male     Is Non-Hispanic African American: No     Diabetic: Yes     Tobacco smoker: No  Systolic Blood Pressure: 417 mmHg     Is BP treated: No     HDL Cholesterol: 43.5 mg/dL     Total Cholesterol: 150 mg/dL  BP Readings from Last 3 Encounters:  04/14/19 134/86  06/22/18 138/76  06/09/18 132/76   Wt Readings from Last 3 Encounters:  04/14/19 225 lb 9.6 oz (102.3 kg)  09/28/18 227 lb (103 kg)  06/22/18 231 lb 12.8 oz (105.1 kg)    Health Maintenance  Topic Date Due  . FOOT EXAM  12/11/2018  . URINE MICROALBUMIN  12/11/2018  . HEMOGLOBIN A1C  12/21/2018  . OPHTHALMOLOGY EXAM  06/06/2019  . TETANUS/TDAP  06/02/2023  . COLONOSCOPY  01/31/2027  . INFLUENZA VACCINE  Completed  . PNEUMOCOCCAL POLYSACCHARIDE VACCINE AGE 48-64 HIGH RISK  Completed  . Hepatitis C Screening  Completed  . HIV Screening   Completed     Patient Active Problem List   Diagnosis Date Noted  . Controlled type 2 diabetes mellitus without complication, without long-term current use of insulin ([place]) 12/10/2017    Priority: High  . Mixed hyperlipidemia 09/16/2017    Priority: High  . Obesity (BMI 30.0-34.9) 09/16/2017    Priority: High  . Lumbar herniated disc 09/16/2017    Priority: Medium  . Left lumbar radiculopathy 09/16/2017    Priority: Medium  . Osteoarthritis, hand 09/16/2017    Priority: Medium  . Recurrent sinusitis 12/16/2015    Priority: Medium  . Degenerative tear of left medial meniscus 12/10/2017    Priority: Low  . Heat rash 09/16/2017    Priority: Low  . Non-seasonal allergic rhinitis 12/16/2015    Priority: Low   Health Maintenance  Topic Date Due  . FOOT EXAM  12/11/2018  . URINE MICROALBUMIN  12/11/2018  . HEMOGLOBIN A1C  12/21/2018  . OPHTHALMOLOGY EXAM  06/06/2019  . TETANUS/TDAP  06/02/2023  . COLONOSCOPY  01/31/2027  . INFLUENZA VACCINE  Completed  . PNEUMOCOCCAL POLYSACCHARIDE VACCINE AGE 48-64 HIGH RISK  Completed  . Hepatitis C Screening  Completed  . HIV Screening  Completed   Immunization History  Administered Date(s) Administered  . Influenza Inj Mdck Quad Pf 03/08/2017, 01/27/2018  . Pneumococcal Polysaccharide-23 03/21/2018  . Tdap 06/01/2013  . Zoster Recombinat (Shingrix) 12/10/2017, 06/22/2018   We updated and reviewed the patient's past history in detail and it is documented below. Allergies: Patient has No Known Allergies. Past Medical History  has a past medical history of Arthritis, Chicken pox, Degenerative tear of left medial meniscus (12/10/2017), Diet-controlled diabetes mellitus ([place]) (12/10/2017), High triglycerides, Hyperlipidemia, Left lumbar radiculopathy (09/16/2017), and Lumbar herniated disc (09/16/2017). Past Surgical History Patient  has a past surgical history that includes Tonsilectomy, adenoidectomy, bilateral myringotomy and tubes and  Tracheostomy. Social History Patient  reports that he has quit smoking. He has never used smokeless tobacco. He reports previous alcohol use. He reports that he does not use drugs. Family History family history includes Arthritis in his father and maternal grandmother; Asthma in his mother; Cancer in his maternal grandfather, maternal grandmother, paternal grandfather, and paternal grandmother; Depression in his sister; Diabetes in his mother; Heart attack in his father; Heart disease in his father and paternal grandfather; Hyperlipidemia in his brother, father, and mother; Kidney disease in his mother; Mental illness in his sister; Miscarriages / Stillbirths in his mother; Stroke in his mother. Review of Systems: Constitutional: negative for fever or malaise Ophthalmic: negative for photophobia, double vision or loss of vision Cardiovascular: negative for chest pain, dyspnea on exertion,  or new LE swelling Respiratory: negative for SOB or persistent cough Gastrointestinal: negative for abdominal pain, change in bowel habits or melena Genitourinary: negative for dysuria or gross hematuria Musculoskeletal: negative for new gait disturbance or muscular weakness Integumentary: negative for new or persistent rashes Neurological: negative for TIA or stroke symptoms Psychiatric: negative for SI or delusions Allergic/Immunologic: negative for hives  Patient Care Team    Relationship Specialty Notifications Start End  Leamon Arnt, MD PCP - General Family Medicine  09/16/17   Melida Quitter, MD Consulting Physician Otolaryngology  09/16/17   Gaynelle Arabian, MD Consulting Physician Orthopedic Surgery  09/16/17   Keene Breath., MD  Ophthalmology  09/16/17   Dr. Gennette Pac    09/16/17    Comment: Dentist   Objective  Vitals: BP 134/86 (BP Location: Left Arm, Patient Position: Sitting, Cuff Size: Large)   Pulse 66   Temp (!) 97.5 F (36.4 C) (Skin)   Ht _0  (1.727 m)   Wt 225 lb 9.6 oz (102.3 kg)    SpO2 100%   BMI 34.30 kg/m  General:  Well developed, well nourished, no acute distress  Psych:  Alert and orientedx3,normal mood and affect HEENT:  Normocephalic, atraumatic, non-icteric sclera, PERRL, oropharynx is clear without mass or exudate, supple neck without adenopathy, mass or thyromegaly Cardiovascular:  Normal S1, S2, RRR without gallop, rub or murmur, nondisplaced PMI, +2 distal pulses in bilateral upper and lower extremities. Respiratory:  Good breath sounds bilaterally, CTAB with normal respiratory effort Gastrointestinal: normal bowel sounds, soft, non-tender, no noted masses. No HSM MSK: no deformities, contusions. Joints are without erythema or swelling. Spine and CVA region are nontender Skin:  Warm, no rashes or suspicious lesions noted Neurologic:    Mental status is normal. CN 2-11 are normal. Gross motor and sensory exams are normal. Stable gait. No tremor GU: No inguinal hernias or adenopathy are appreciated bilaterally   Assessment  1. Annual physical exam   2. Controlled type 2 diabetes mellitus without complication, without long-term current use of insulin ([place])   3. Mixed hyperlipidemia   4. Lumbar herniated disc      Plan  Male Wellness Visit:  Age appropriate Health Maintenance and Prevention measures were discussed with patient. Included topics are cancer screening recommendations, ways to keep healthy (see AVS) including dietary and exercise recommendations, regular eye and dental care, use of seat belts, and avoidance of moderate alcohol use and tobacco use.   BMI: discussed patient's BMI and encouraged positive lifestyle modifications to help get to or maintain a target BMI.  HM needs and immunizations were addressed and ordered. See below for orders. See HM and immunization section for updates.  Routine labs and screening tests ordered including cmp, cbc and lipids where appropriate.  Discussed recommendations regarding Vit D and calcium  supplementation (see AVS)  Chronic disease f/u and/or acute problem visit: (deemed necessary to be done in addition to the wellness visit):  DM: Excellent control.  Continue current medications.  If continues healthy diet and weight loss may be able to decrease Metformin.  Immediately resolve this.  He is encouraged  HLD: Recheck today on statin and fenofibrate, fasting  Obesity: Improving.  Continue to make healthy choices.  Recommend core strengthening to help prevent back problems.  Follow up: Return in about 6 months (around 10/12/2019) for follow up Diabetes.   Commons side effects, risks, benefits, and alternatives for medications and treatment plan prescribed today were discussed, and the patient expressed understanding of  the given instructions. Patient is instructed to call or message via MyChart if he/she has any questions or concerns regarding our treatment plan. No barriers to understanding were identified. We discussed Red Flag symptoms and signs in detail. Patient expressed understanding regarding what to do in case of urgent or emergency type symptoms.   Medication list was reconciled, printed and provided to the patient in AVS. Patient instructions and summary information was reviewed with the patient as documented in the AVS. This note was prepared with assistance of Dragon voice recognition software. Occasional wrong-word or sound-a-like substitutions may have occurred due to the inherent limitations of voice recognition software  Orders Placed This Encounter  Procedures  . CBC w/Diff  . CMP  . Lipids  . Urine MAC  . A1C POCT   No orders of the defined types were placed in this encounter.

## 2019-06-10 ENCOUNTER — Telehealth: Payer: Self-pay

## 2019-06-10 NOTE — Telephone Encounter (Signed)
Pt called [facility] Saturday morning with possible blood in his urine. The nurse gave him the Saturday Clinic number. In speaking with him he said he had eaten quite a few beets last night. He has not had them in a long time. Early this am when he voided, the urine was kind of an orange red. No dysuria, frequency, or urgency. The next time he went to void, it had changed to a light yellow normal urine color.  I did not want to do a virtual visit without a urinalysis and the fact that it cleared up and no other symtpoms.  He agreed that if the dark urine returns or he starts to notice dysuria, frequency, and/or urgency that he will go to an Urgent Care.  Advised him I would share this with Dr Mardelle Matte.

## 2019-06-12 NOTE — Telephone Encounter (Signed)
Noted and agree. 

## 2019-06-24 ENCOUNTER — Encounter: Payer: Self-pay | Admitting: Family Medicine

## 2019-06-24 MED ORDER — METFORMIN HCL 1000 MG PO TABS: ORAL_TABLET | ORAL | 1 refills | Status: DC

## 2019-06-24 MED ORDER — BETAMETHASONE DIPROPIONATE AUG 0.05 % EX CREA: TOPICAL_CREAM | CUTANEOUS | 2 refills | Status: DC

## 2019-06-24 MED ORDER — KETOCONAZOLE 2 % EX CREA: 1.0000 "application " | TOPICAL_CREAM | Freq: Two times a day (BID) | CUTANEOUS | 2 refills | Status: DC | PRN

## 2019-07-05 ENCOUNTER — Other Ambulatory Visit: Payer: Self-pay | Admitting: Family Medicine

## 2019-08-16 ENCOUNTER — Telehealth: Payer: Self-pay | Admitting: Family Medicine

## 2019-08-16 MED ORDER — FENOFIBRATE 160 MG PO TABS: 160.0000 mg | ORAL_TABLET | Freq: Every day | ORAL | 1 refills | Status: DC

## 2019-08-16 NOTE — Telephone Encounter (Signed)
Medication sent in. 

## 2019-08-16 NOTE — Telephone Encounter (Signed)
MEDICATION: Fenofibrate 160 MG tablet  [facility]Y: [facility] [address]  Comments:   **Let patient know to contact pharmacy at the end of the day to make sure medication is ready. **  ** Please notify patient to allow 48-72 hours to process**  **Encourage patient to contact the pharmacy for refills or they can request refills through [facility]**

## 2019-08-17 ENCOUNTER — Other Ambulatory Visit: Payer: Self-pay

## 2019-08-17 ENCOUNTER — Ambulatory Visit (INDEPENDENT_AMBULATORY_CARE_PROVIDER_SITE_OTHER): Payer: Self-pay | Admitting: Family Medicine

## 2019-08-17 ENCOUNTER — Encounter: Payer: Self-pay | Admitting: Family Medicine

## 2019-08-17 VITALS — BP 156/78 | HR 81 | Temp 98.2°F | Ht 68.0 in | Wt 229.0 lb

## 2019-08-17 DIAGNOSIS — R3129 Other microscopic hematuria: Secondary | ICD-10-CM | POA: Diagnosis not present

## 2019-08-17 LAB — POCT URINALYSIS DIPSTICK
Bilirubin, UA: NEGATIVE
Blood, UA: NEGATIVE
Glucose, UA: NEGATIVE
Ketones, UA: NEGATIVE
Leukocytes, UA: NEGATIVE
Nitrite, UA: NEGATIVE
Protein, UA: NEGATIVE
Spec Grav, UA: 1.02 (ref 1.010–1.025)
Urobilinogen, UA: 0.2 E.U./dL
pH, UA: 5.5 (ref 5.0–8.0)

## 2019-08-17 NOTE — Patient Instructions (Signed)
-  your urine here looks good on our dip. Sending off to look at UA under microscope. Culture also pending. Checking labs as well with covid shot to make sure no low platelets.   Very nice to meet you,  Dr. Artis Flock

## 2019-08-17 NOTE — Progress Notes (Signed)
Patient: Karl [name] MRN: [id number] DOB: January 15, 1956 PCP: Leamon Arnt, MD     Subjective:  Chief Complaint  Patient presents with  . Hematuria    microscopic blood in urine    HPI: The patient is a 64 y.o. male who presents today for possible microscopic hematuria. He had his flight physical and doctor said he had blood in his urine. He has no symptoms at all. Do not see a PSA in his chart. He smoked when he was younger (remote past in high school). He denies any blood in his urine, dysuria, weak stream, straining to urinate. No nocturia. No pain with BM and no pain with sex.   He did have his first covid shot on Tuesday and his flight physical was yesterday. Denies any easy bruising, blood in stool.   Review of Systems  Constitutional: Negative for chills, fatigue and fever.  HENT: Negative for dental problem, ear pain, hearing loss and trouble swallowing.   Eyes: Negative for visual disturbance.  Respiratory: Negative for cough, chest tightness and shortness of breath.   Cardiovascular: Negative for chest pain, palpitations and leg swelling.  Gastrointestinal: Negative for abdominal pain, blood in stool, diarrhea and nausea.  Endocrine: Negative for cold intolerance, polydipsia, polyphagia and polyuria.  Genitourinary: Negative for decreased urine volume, difficulty urinating, dysuria, flank pain, hematuria and urgency.  Musculoskeletal: Negative for arthralgias.  Skin: Negative for rash.  Neurological: Negative for dizziness and headaches.  Psychiatric/Behavioral: Negative for dysphoric mood and sleep disturbance. The patient is not nervous/anxious.     Allergies Patient has No Known Allergies.  Past Medical History Patient  has a past medical history of Arthritis, Chicken pox, Degenerative tear of left medial meniscus (12/10/2017), Diet-controlled diabetes mellitus ([place]) (12/10/2017), High triglycerides, Hyperlipidemia, Left lumbar radiculopathy (09/16/2017), and Lumbar  herniated disc (09/16/2017).  Surgical History Patient  has a past surgical history that includes Tonsilectomy, adenoidectomy, bilateral myringotomy and tubes and Tracheostomy.  Family History Pateint's family history includes Arthritis in his father and maternal grandmother; Asthma in his mother; Cancer in his maternal grandfather, maternal grandmother, paternal grandfather, and paternal grandmother; Depression in his sister; Diabetes in his mother; Heart attack in his father; Heart disease in his father and paternal grandfather; Hyperlipidemia in his brother, father, and mother; Kidney disease in his mother; Mental illness in his sister; Miscarriages / Stillbirths in his mother; Stroke in his mother.  Social History Patient  reports that he has quit smoking. He has never used smokeless tobacco. He reports previous alcohol use. He reports that he does not use drugs.    Objective: Vitals:   08/17/19 1513  BP: (!) 156/78  Pulse: 81  Temp: 98.2 F (36.8 C)  TempSrc: Temporal  SpO2: 96%  Weight: 229 lb (103.9 kg)  Height: 5\' 8"  (1.727 m)    Body mass index is 34.82 kg/m.  Physical Exam Vitals reviewed.  Constitutional:      Appearance: Normal appearance. He is obese.  HENT:     Head: Normocephalic and atraumatic.  Cardiovascular:     Rate and Rhythm: Normal rate and regular rhythm.     Heart sounds: Normal heart sounds.  Pulmonary:     Effort: Pulmonary effort is normal.     Breath sounds: Normal breath sounds.  Abdominal:     General: Bowel sounds are normal.     Palpations: Abdomen is soft.  Skin:    General: Skin is warm.     Capillary Refill: Capillary refill takes less than  2 seconds.     Findings: No bruising.  Neurological:     General: No focal deficit present.     Mental Status: He is alert and oriented to person, place, and time.  Psychiatric:        Mood and Affect: Mood normal.        Behavior: Behavior normal.        UA dip: normal, no blood    Assessment/plan: 1. Microscopic hematuria No blood on our dip today. Will run UA with microscopy, check culture and PSA. Possible lab error. With covid shot prior checking cbc for platelet function and other labs with few case studies of thrombocytopenia.  - Urinalysis, Routine w reflex microscopic - Urine Culture - POCT urinalysis dipstick - CBC with Differential/Platelet - PSA - Protime-INR - Comprehensive metabolic panel  This visit occurred during the SARS-CoV-2 public health emergency.  Safety protocols were in place, including screening questions prior to the visit, additional usage of staff PPE, and extensive cleaning of exam room while observing appropriate contact time as indicated for disinfecting solutions.     Return if symptoms worsen or fail to improve.   Orland Mustard, MD  [facility]  08/17/2019

## 2019-08-18 ENCOUNTER — Ambulatory Visit: Payer: 59 | Admitting: Family Medicine

## 2019-08-18 LAB — CBC WITH DIFFERENTIAL/PLATELET
Basophils Absolute: 0 10*3/uL (ref 0.0–0.1)
Basophils Relative: 0.4 % (ref 0.0–3.0)
Eosinophils Absolute: 0.4 10*3/uL (ref 0.0–0.7)
Eosinophils Relative: 4.7 % (ref 0.0–5.0)
HCT: 45.2 % (ref 39.0–52.0)
Hemoglobin: 15.4 g/dL (ref 13.0–17.0)
Lymphocytes Relative: 25.7 % (ref 12.0–46.0)
Lymphs Abs: 2.3 10*3/uL (ref 0.7–4.0)
MCHC: 34.1 g/dL (ref 30.0–36.0)
MCV: 87.8 fl (ref 78.0–100.0)
Monocytes Absolute: 0.7 10*3/uL (ref 0.1–1.0)
Monocytes Relative: 7.9 % (ref 3.0–12.0)
Neutro Abs: 5.6 10*3/uL (ref 1.4–7.7)
Neutrophils Relative %: 61.3 % (ref 43.0–77.0)
Platelets: 328 10*3/uL (ref 150.0–400.0)
RBC: 5.15 Mil/uL (ref 4.22–5.81)
RDW: 13.1 % (ref 11.5–15.5)
WBC: 9.1 10*3/uL (ref 4.0–10.5)

## 2019-08-18 LAB — URINALYSIS, ROUTINE W REFLEX MICROSCOPIC
Bilirubin Urine: NEGATIVE
Hgb urine dipstick: NEGATIVE
Ketones, ur: NEGATIVE
Leukocytes,Ua: NEGATIVE
Nitrite: NEGATIVE
RBC / HPF: NONE SEEN (ref 0–?)
Specific Gravity, Urine: 1.025 (ref 1.000–1.030)
Total Protein, Urine: NEGATIVE
Urine Glucose: 100 — AB
Urobilinogen, UA: 0.2 (ref 0.0–1.0)
pH: 6 (ref 5.0–8.0)

## 2019-08-18 LAB — COMPREHENSIVE METABOLIC PANEL
ALT: 22 U/L (ref 0–53)
AST: 19 U/L (ref 0–37)
Albumin: 4.4 g/dL (ref 3.5–5.2)
Alkaline Phosphatase: 38 U/L — ABNORMAL LOW (ref 39–117)
BUN: 19 mg/dL (ref 6–23)
CO2: 27 mEq/L (ref 19–32)
Calcium: 10.1 mg/dL (ref 8.4–10.5)
Chloride: 103 mEq/L (ref 96–112)
Creatinine, Ser: 1.08 mg/dL (ref 0.40–1.50)
GFR: 69.11 mL/min (ref >60.00)
Glucose, Bld: 155 mg/dL — ABNORMAL HIGH (ref 70–99)
Potassium: 4.6 mEq/L (ref 3.5–5.1)
Sodium: 138 mEq/L (ref 135–145)
Total Bilirubin: 0.5 mg/dL (ref 0.2–1.2)
Total Protein: 7.1 g/dL (ref 6.0–8.3)

## 2019-08-18 LAB — URINE CULTURE
MICRO NUMBER:: 10266468
Result:: NO GROWTH
SPECIMEN QUALITY:: ADEQUATE

## 2019-08-18 LAB — PROTIME-INR
INR: 1 ratio (ref 0.8–1.0)
Prothrombin Time: 11 s (ref 9.6–13.1)

## 2019-08-18 LAB — PSA: PSA: 0.34 ng/mL (ref 0.10–4.00)

## 2019-10-12 ENCOUNTER — Other Ambulatory Visit: Payer: Self-pay

## 2019-10-12 ENCOUNTER — Ambulatory Visit (INDEPENDENT_AMBULATORY_CARE_PROVIDER_SITE_OTHER): Payer: Managed Care, Other (non HMO) | Admitting: Family Medicine

## 2019-10-12 ENCOUNTER — Encounter: Payer: Self-pay | Admitting: Family Medicine

## 2019-10-12 VITALS — BP 122/82 | HR 86 | Temp 97.0°F | Resp 15 | Ht 68.0 in | Wt 228.8 lb

## 2019-10-12 DIAGNOSIS — E662 Morbid (severe) obesity with alveolar hypoventilation: Secondary | ICD-10-CM

## 2019-10-12 DIAGNOSIS — E782 Mixed hyperlipidemia: Secondary | ICD-10-CM | POA: Diagnosis not present

## 2019-10-12 DIAGNOSIS — Z6834 Body mass index (BMI) 34.0-34.9, adult: Secondary | ICD-10-CM

## 2019-10-12 DIAGNOSIS — E119 Type 2 diabetes mellitus without complications: Secondary | ICD-10-CM | POA: Diagnosis not present

## 2019-10-12 LAB — POCT GLYCOSYLATED HEMOGLOBIN (HGB A1C): Hemoglobin A1C: 6.3 % — AB (ref 4.0–5.6)

## 2019-10-12 NOTE — Patient Instructions (Signed)
Please return in November 2021 for your annual complete physical; please come fasting.  Clear Lungs herbals: I've reviewed the chinese herbals. I am familiar with many of them but not all of them. I am unaware of any ill effects from them and many are well known to be helpful. Continue it.   If you have any questions or concerns, please don't hesitate to send me a message via MyChart or call the office at [phone]. Thank you for visiting with [place] today! It's our pleasure caring for you.

## 2019-10-12 NOTE — Progress Notes (Signed)
Subjective  CC:  Chief Complaint  Patient presents with  . Diabetes    blood sugar low 120's at home  . Hypertension    HPI: Karl [name] is a 64 y.o. male who presents to the office today for follow up of diabetes and problems listed above in the chief complaint.   Diabetes follow up: His diabetic control is reported as Unchanged. Has had to eat a little worse while on the road since last visit and weight is up a few pounds.  He denies exertional CP or SOB or symptomatic hypoglycemia. He denies foot sores or paresthesias. Neg urine MAC not on ace and normotensive  HLD on high dose statin.   Obesity: as above, diet is mostly good but barriers are travel due to career and limited food choices at times.   Wt Readings from Last 3 Encounters:  10/12/19 228 lb 12.8 oz (103.8 kg)  08/17/19 229 lb (103.9 kg)  04/14/19 225 lb 9.6 oz (102.3 kg)    BP Readings from Last 3 Encounters:  10/12/19 122/82  08/17/19 (!) 156/78  04/14/19 134/86    Assessment  1. Controlled type 2 diabetes mellitus without complication, without long-term current use of insulin ([place])   2. Mixed hyperlipidemia   3. Class 1 obesity with alveolar hypoventilation, serious comorbidity, and body mass index (BMI) of 34.0 to 34.9 in adult [facility])      Plan   Diabetes is currently well controlled. Continue same mgt.   HLD: on statin and controlled.   Obesity: discussed diet. Has good knowledge base. Will continue to problem solve to optimize his diet while away.   Follow up: November for cpe and dm f/u.. Orders Placed This Encounter  Procedures  . POCT HgB A1C   No orders of the defined types were placed in this encounter.     Immunization History  Administered Date(s) Administered  . Influenza Inj Mdck Quad Pf 03/08/2017, 01/27/2018  . PFIZER SARS-COV-2 Vaccination 08/15/2019, 09/01/2019  . Pneumococcal Polysaccharide-23 03/21/2018  . Tdap 06/01/2013  . Zoster Recombinat (Shingrix) 12/10/2017,  06/22/2018    Diabetes Related Lab Review: Lab Results  Component Value Date   HGBA1C 6.3 (A) 10/12/2019   HGBA1C 5.7 (A) 04/14/2019   HGBA1C 6.0 (A) 06/22/2018    Lab Results  Component Value Date   MICROALBUR 0.7 04/14/2019   Lab Results  Component Value Date   CREATININE 1.08 08/17/2019   BUN 19 08/17/2019   NA 138 08/17/2019   K 4.6 08/17/2019   CL 103 08/17/2019   CO2 27 08/17/2019   Lab Results  Component Value Date   CHOL 150 04/14/2019   CHOL 150 12/10/2017   CHOL 158 10/29/2017   Lab Results  Component Value Date   HDL 40.90 04/14/2019   HDL 43.50 12/10/2017   HDL 41 10/29/2017   Lab Results  Component Value Date   LDLCALC 77 04/14/2019   LDLCALC 74 12/10/2017   LDLCALC 82 10/29/2017   Lab Results  Component Value Date   TRIG 162.0 (H) 04/14/2019   TRIG 161.0 (H) 12/10/2017   TRIG 177 (A) 10/29/2017   Lab Results  Component Value Date   CHOLHDL 4 04/14/2019   CHOLHDL 3 12/10/2017   No results found for: LDLDIRECT The 10-year ASCVD risk score Mikey Bussing DC Jr., et al., 2013) is: 15.6%   Values used to calculate the score:     Age: 36 years     Sex: Male  Is Non-Hispanic African American: No     Diabetic: Yes     Tobacco smoker: No     Systolic Blood Pressure: 937 mmHg     Is BP treated: No     HDL Cholesterol: 40.9 mg/dL     Total Cholesterol: 150 mg/dL I have reviewed the PMH, Fam and Soc history. Patient Active Problem List   Diagnosis Date Noted  . Controlled type 2 diabetes mellitus without complication, without long-term current use of insulin ([place]) 12/10/2017    Priority: High  . Mixed hyperlipidemia 09/16/2017    Priority: High  . Obesity (BMI 30.0-34.9) 09/16/2017    Priority: High  . Lumbar herniated disc 09/16/2017    Priority: Medium    L3,4,5 by MRI, NeuroSurgery   . Left lumbar radiculopathy 09/16/2017    Priority: Medium    Evaluated by NS and treated with PT, [facility].    . Osteoarthritis, hand 09/16/2017     Priority: Medium  . Recurrent sinusitis 12/16/2015    Priority: Medium  . Degenerative tear of left medial meniscus 12/10/2017    Priority: Low    Dr. Maureen Ralphs   . Heat rash 09/16/2017    Priority: Low    Recurring;    . Non-seasonal allergic rhinitis 12/16/2015    Priority: Low    Social History: Patient  reports that he has quit smoking. He has never used smokeless tobacco. He reports previous alcohol use. He reports that he does not use drugs.  Review of Systems: Ophthalmic: negative for eye pain, loss of vision or double vision Cardiovascular: negative for chest pain Respiratory: negative for SOB or persistent cough Gastrointestinal: negative for abdominal pain Genitourinary: negative for dysuria or gross hematuria MSK: negative for foot lesions Neurologic: negative for weakness or gait disturbance  Objective  Vitals: BP 122/82   Pulse 86   Temp (!) 97 F (36.1 C) (Temporal)   Resp 15   Ht _0  (1.727 m)   Wt 228 lb 12.8 oz (103.8 kg)   SpO2 98%   BMI 34.79 kg/m  General: well appearing, no acute distress  Psych:  Alert and oriented, normal mood and affect HEENT:  Normocephalic, atraumatic, moist mucous membranes, supple neck  Cardiovascular:  Nl S1 and S2, RRR without murmur, gallop or rub. no edema Respiratory:  Good breath sounds bilaterally, CTAB with normal effort, no rales   Diabetic education: ongoing education regarding chronic disease management for diabetes was given today. We continue to reinforce the ABC's of diabetic management: A1c (<7 or 8 dependent upon patient), tight blood pressure control, and cholesterol management with goal LDL < 100 minimally. We discuss diet strategies, exercise recommendations, medication options and possible side effects. At each visit, we review recommended immunizations and preventive care recommendations for diabetics and stress that good diabetic control can prevent other problems. See below for this patient's  data.    Commons side effects, risks, benefits, and alternatives for medications and treatment plan prescribed today were discussed, and the patient expressed understanding of the given instructions. Patient is instructed to call or message via MyChart if he/she has any questions or concerns regarding our treatment plan. No barriers to understanding were identified. We discussed Red Flag symptoms and signs in detail. Patient expressed understanding regarding what to do in case of urgent or emergency type symptoms.   Medication list was reconciled, printed and provided to the patient in AVS. Patient instructions and summary information was reviewed with the patient as documented in the  AVS. This note was prepared with assistance of Dragon voice recognition software. Occasional wrong-word or sound-a-like substitutions may have occurred due to the inherent limitations of voice recognition software  This visit occurred during the SARS-CoV-2 public health emergency.  Safety protocols were in place, including screening questions prior to the visit, additional usage of staff PPE, and extensive cleaning of exam room while observing appropriate contact time as indicated for disinfecting solutions.

## 2020-01-03 ENCOUNTER — Other Ambulatory Visit: Payer: Self-pay | Admitting: Family Medicine

## 2020-01-03 ENCOUNTER — Other Ambulatory Visit: Payer: Self-pay

## 2020-01-03 ENCOUNTER — Telehealth (INDEPENDENT_AMBULATORY_CARE_PROVIDER_SITE_OTHER): Payer: Managed Care, Other (non HMO) | Admitting: Family Medicine

## 2020-01-03 DIAGNOSIS — R197 Diarrhea, unspecified: Secondary | ICD-10-CM

## 2020-01-03 NOTE — Progress Notes (Signed)
Patient ID: Karl [name], male   DOB: 06-10-1955, 64 y.o.   MRN: [id number]   This visit type was conducted due to national recommendations for restrictions regarding the COVID-19 pandemic in an effort to limit this patient's exposure and mitigate transmission in our community.   Virtual Visit via Video Note  I connected with Karl [name] on 01/03/20 at  1:45 PM EDT by a video enabled telemedicine application and verified that I am speaking with the correct person using two identifiers.  Location patient: home Location provider:work or home office Persons participating in the virtual visit: patient, provider  I discussed the limitations of evaluation and management by telemedicine and the availability of in person appointments. The patient expressed understanding and agreed to proceed.   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  His pilot coworker ate a Caesar salad and later that night developed some vomiting and diarrhea.  By Saturday patient had vomiting and diarrhea.  He had eaten a hamburger with some lettuce and their concern was whether the lettuce was contaminated.  They reported their symptoms to local health department.  They are not aware of any definite E. coli outbreak.  Has had no vomiting since initial episode but has had some persistent loose stools about 5 to 6/day.  Some intermittent abdominal cramping.  He has not been aware of fever at any time.  No recent antibiotic use.  No travel out of country.  No bloody stools.  He has some general malaise.  He is keeping down fluids fairly well.  Has not tried any Imodium.  ROS: See pertinent positives and negatives per HPI.  Past Medical History:  Diagnosis Date  . Arthritis   . Chicken pox   . Degenerative tear of left medial meniscus 12/10/2017   Dr. Despina Hick  . Diet-controlled diabetes mellitus ([facility]) 12/10/2017   a1c 6.05 Jun 2016 and July 2019  . High triglycerides   . Hyperlipidemia   . Left lumbar radiculopathy 09/16/2017   Evaluated by NS and treated with PT, [facility].   . Lumbar herniated disc 09/16/2017   L3,4,5 by MRI, NeuroSurgery    Past Surgical History:  Procedure Laterality Date  . TONSILECTOMY, ADENOIDECTOMY, BILATERAL MYRINGOTOMY AND TUBES     1964  . TRACHEOSTOMY     1959    Family History  Problem Relation Age of Onset  . Asthma Mother   . Diabetes Mother   . Hyperlipidemia Mother   . Kidney disease Mother   . Miscarriages / [place] Mother   . Stroke Mother   . Arthritis Father   . Heart disease Father   . Hyperlipidemia Father   . Heart attack Father   . Depression Sister   . Mental illness Sister   . Hyperlipidemia Brother   . Arthritis Maternal Grandmother   . Cancer Maternal Grandmother   . Cancer Maternal Grandfather   . Cancer Paternal Grandmother   . Cancer Paternal Grandfather   . Heart disease Paternal Grandfather     SOCIAL HX: Non-smoker   Current Outpatient Medications:  .  augmented betamethasone dipropionate (DIPROLENE-AF) 0.05 % cream, APPLY A THIN FILM EXTERNALLY TO AFFECTED AREA TWICE A DAY AS NEEDED, Disp: 30 g, Rfl: 2 .  Coenzyme Q10 (CO Q 10) 10 MG CAPS, Take by mouth., Disp: , Rfl:  .  ELDERBERRY PO,  Take by mouth., Disp: , Rfl:  .  fenofibrate 160 MG tablet, Take 1 tablet (160 mg total) by mouth daily., Disp: 90 tablet, Rfl: 1 .  fluticasone (FLONASE) 50 MCG/ACT nasal spray, Place 2 sprays into the nose daily., Disp: , Rfl:  .  ketoconazole (NIZORAL) 2 % cream, Apply 1 application topically 2 (two) times daily as needed for irritation., Disp: 30 g, Rfl: 2 .  metFORMIN (GLUCOPHAGE) 1000 MG tablet, TAKE ONE TABLET BY MOUTH TWICE A DAY WITH A MEAL, Disp: 180 tablet, Rfl: 1 .  montelukast (SINGULAIR) 10 MG tablet, Take 1 tablet (10 mg total) by mouth at bedtime., Disp: 30 tablet, Rfl: 3 .  Multiple Vitamins-Minerals (CENTRUM SILVER 50+MEN PO), Take  by mouth., Disp: , Rfl:  .  Multiple Vitamins-Minerals (OCUVITE ADULT 50+ PO), Take by mouth., Disp: , Rfl:  .  Naproxen Sodium (ALEVE) 220 MG CAPS, Take by mouth., Disp: , Rfl:  .  Omega 3-6-9 Fatty Acids (OMEGA 3-6-9 COMPLEX PO), Take by mouth., Disp: , Rfl:  .  Omega-3 Fatty Acids (FISH OIL) 1000 MG CAPS, Take by mouth., Disp: , Rfl:  .  OVER THE COUNTER MEDICATION, Clear Lungs, Disp: , Rfl:  .  simvastatin (ZOCOR) 80 MG tablet, TAKE ONE-HALF (0.5) TABLET BY MOUTH DAILY, Disp: 45 tablet, Rfl: 3  EXAM:  VITALS per patient if applicable:  GENERAL: alert, oriented, appears well and in no acute distress  HEENT: atraumatic, conjunttiva clear, no obvious abnormalities on inspection of external nose and ears  NECK: normal movements of the head and neck  LUNGS: on inspection no signs of respiratory distress, breathing rate appears normal, no obvious gross SOB, gasping or wheezing  CV: no obvious cyanosis  MS: moves all visible extremities without noticeable abnormality  PSYCH/NEURO: pleasant and cooperative, no obvious depression or anxiety, speech and thought processing grossly intact  ASSESSMENT AND PLAN:  Discussed the following assessment and plan:  Diarrhea.  Patient had concerns for possible E. coli.  He does not have any red flag symptoms such as bloody stools, recurrent vomiting, fever.  We explained this could be viral but even if this is E. coli usually this will be self resolving.  Since he is not having more than 6 stools per day or any fever or bloody stools we recommended supportive measures with plenty of fluids and plenty of potassium in diet.  We recommended bland diet and avoid foods high and fat or sugar.  He will consider trial of over-the-counter Imodium.  If symptoms not improving next couple days be in touch     I discussed the assessment and treatment plan with the patient. The patient was provided an opportunity to ask questions and all were answered. The patient  agreed with the plan and demonstrated an understanding of the instructions.   The patient was advised to call back or seek an in-person evaluation if the symptoms worsen or if the condition fails to improve as anticipated.     Evelena Peat, MD

## 2020-01-31 ENCOUNTER — Other Ambulatory Visit: Payer: Self-pay | Admitting: Family Medicine

## 2020-04-23 ENCOUNTER — Encounter: Payer: Self-pay | Admitting: Family Medicine

## 2020-04-23 ENCOUNTER — Other Ambulatory Visit: Payer: Self-pay

## 2020-04-23 ENCOUNTER — Ambulatory Visit (INDEPENDENT_AMBULATORY_CARE_PROVIDER_SITE_OTHER): Payer: Managed Care, Other (non HMO) | Admitting: Family Medicine

## 2020-04-23 VITALS — BP 126/80 | HR 78 | Temp 97.7°F | Ht 68.0 in | Wt 222.8 lb

## 2020-04-23 DIAGNOSIS — Z0000 Encounter for general adult medical examination without abnormal findings: Secondary | ICD-10-CM | POA: Diagnosis not present

## 2020-04-23 DIAGNOSIS — M5416 Radiculopathy, lumbar region: Secondary | ICD-10-CM

## 2020-04-23 DIAGNOSIS — H60541 Acute eczematoid otitis externa, right ear: Secondary | ICD-10-CM

## 2020-04-23 DIAGNOSIS — E119 Type 2 diabetes mellitus without complications: Secondary | ICD-10-CM

## 2020-04-23 DIAGNOSIS — M5126 Other intervertebral disc displacement, lumbar region: Secondary | ICD-10-CM

## 2020-04-23 DIAGNOSIS — E669 Obesity, unspecified: Secondary | ICD-10-CM

## 2020-04-23 DIAGNOSIS — E782 Mixed hyperlipidemia: Secondary | ICD-10-CM | POA: Diagnosis not present

## 2020-04-23 MED ORDER — NEOMYCIN-POLYMYXIN-HC 3.5-10000-1 OT SOLN: 3.0000 [drp] | Freq: Three times a day (TID) | OTIC | 0 refills | Status: DC

## 2020-04-23 NOTE — Patient Instructions (Signed)
Please return in 6 months to recheck diabetes and blood pressure.  I will release your lab results to you on your MyChart account with further instructions. Please reply with any questions.     If you have any questions or concerns, please don't hesitate to send me a message via MyChart or call the office at [phone]. Thank you for visiting with us today! It's our pleasure caring for you.  

## 2020-04-23 NOTE — Progress Notes (Signed)
Subjective  Chief Complaint  Patient presents with  . Annual Exam  . Diabetes  . Hyperlipidemia    HPI: Karl [name] is a 64 y.o. male who presents to [facility] [place] today for a Male Wellness Visit. He also has the concerns and/or needs as listed above in the chief complaint. These will be addressed in addition to the Health Maintenance Visit.   Wellness Visit: annual visit with health maintenance review and exam    Health maintenance: Screens are up-to-date.  Immunizations up-to-date.  Feeling well. Lifestyle: Body mass index is 33.88 kg/m. Wt Readings from Last 3 Encounters:  04/23/20 222 lb 12.8 oz (101.1 kg)  10/12/19 228 lb 12.8 oz (103.8 kg)  08/17/19 229 lb (103.9 kg)    Chronic disease management visit and/or acute problem visit:  Diabetes follow up: His diabetic control is reported as Unchanged.  No symptoms of hypoglycemia.  Fasting sugars average between 100 and 120. He denies exertional CP or SOB or symptomatic hypoglycemia. He denies foot sores or paresthesias.  Eye exam is up-to-date.  Due for lab work.  Tolerating Metformin.  Hypertension and hyperlipidemia both of them well controlled.  Tolerating medications.  No adverse effects.  No symptoms of heart failure or heart disease.  No lower extremity edema.  Active  Intermittent low back pain due to herniated lumbar disc.  He has seen neurosurgery and has had physical therapy.  He does yoga which is also helpful.  He is in a chiropractor at times.  No weakness.  C/o bleeding out of right ear canal for a few days. No pain but has had fullness in that ear.  Some allergy symptoms.  He does use Q-tips.  Also has history of asthma.  Has a mild rash on his left upper arm.  Immunization History  Administered Date(s) Administered  . Influenza Inj Mdck Quad Pf 03/08/2017, 01/27/2018  . Influenza, Quadrivalent, Recombinant, Inj, Pf 03/27/2020  . PFIZER SARS-COV-2 Vaccination 08/15/2019, 09/01/2019   . Pneumococcal Polysaccharide-23 03/21/2018  . Tdap 06/01/2013  . Zoster Recombinat (Shingrix) 12/10/2017, 06/22/2018    Diabetes Related Lab Review: Lab Results  Component Value Date   HGBA1C 6.3 (A) 10/12/2019   HGBA1C 5.7 (A) 04/14/2019   HGBA1C 6.0 (A) 06/22/2018    Lab Results  Component Value Date   MICROALBUR 0.7 04/14/2019   Lab Results  Component Value Date   CREATININE 1.08 08/17/2019   BUN 19 08/17/2019   NA 138 08/17/2019   K 4.6 08/17/2019   CL 103 08/17/2019   CO2 27 08/17/2019   Lab Results  Component Value Date   CHOL 150 04/14/2019   CHOL 150 12/10/2017   CHOL 158 10/29/2017   Lab Results  Component Value Date   HDL 40.90 04/14/2019   HDL 43.50 12/10/2017   HDL 41 10/29/2017   Lab Results  Component Value Date   LDLCALC 77 04/14/2019   LDLCALC 74 12/10/2017   LDLCALC 82 10/29/2017   Lab Results  Component Value Date   TRIG 162.0 (H) 04/14/2019   TRIG 161.0 (H) 12/10/2017   TRIG 177 (A) 10/29/2017   Lab Results  Component Value Date   CHOLHDL 4 04/14/2019   CHOLHDL 3 12/10/2017   No results found for: LDLDIRECT The 10-year ASCVD risk score Denman George DC Jr., et al., 2013) is: 17.8%   Values used to calculate the score:     Age: 86 years     Sex: Male  Is Non-Hispanic African American: No     Diabetic: Yes     Tobacco smoker: No     Systolic Blood Pressure: 126 mmHg     Is BP treated: No     HDL Cholesterol: 40.9 mg/dL     Total Cholesterol: 150 mg/dL  BP Readings from Last 3 Encounters:  04/23/20 126/80  10/12/19 122/82  08/17/19 (!) 156/78   Wt Readings from Last 3 Encounters:  04/23/20 222 lb 12.8 oz (101.1 kg)  10/12/19 228 lb 12.8 oz (103.8 kg)  08/17/19 229 lb (103.9 kg)    Health Maintenance  Topic Date Due  . HEMOGLOBIN A1C  04/13/2020  . URINE MICROALBUMIN  04/13/2020  . OPHTHALMOLOGY EXAM  07/14/2020  . FOOT EXAM  04/23/2021  . TETANUS/TDAP  06/02/2023  . COLONOSCOPY  01/31/2027  . INFLUENZA VACCINE   Completed  . PNEUMOCOCCAL POLYSACCHARIDE VACCINE AGE 15-64 HIGH RISK  Completed  . COVID-19 Vaccine  Completed  . Hepatitis C Screening  Completed  . HIV Screening  Completed     Patient Active Problem List   Diagnosis Date Noted  . Controlled type 2 diabetes mellitus without complication, without long-term current use of insulin (HCC) 12/10/2017  . Mixed hyperlipidemia 09/16/2017  . Obesity (BMI 30.0-34.9) 09/16/2017  . Lumbar herniated disc 09/16/2017  . Left lumbar radiculopathy 09/16/2017  . Osteoarthritis, hand 09/16/2017  . Recurrent sinusitis 12/16/2015  . Degenerative tear of left medial meniscus 12/10/2017  . Heat rash 09/16/2017  . Non-seasonal allergic rhinitis 12/16/2015   Health Maintenance  Topic Date Due  . HEMOGLOBIN A1C  04/13/2020  . URINE MICROALBUMIN  04/13/2020  . OPHTHALMOLOGY EXAM  07/14/2020  . FOOT EXAM  04/23/2021  . TETANUS/TDAP  06/02/2023  . COLONOSCOPY  01/31/2027  . INFLUENZA VACCINE  Completed  . PNEUMOCOCCAL POLYSACCHARIDE VACCINE AGE 15-64 HIGH RISK  Completed  . COVID-19 Vaccine  Completed  . Hepatitis C Screening  Completed  . HIV Screening  Completed   Immunization History  Administered Date(s) Administered  . Influenza Inj Mdck Quad Pf 03/08/2017, 01/27/2018  . Influenza, Quadrivalent, Recombinant, Inj, Pf 03/27/2020  . PFIZER SARS-COV-2 Vaccination 08/15/2019, 09/01/2019  . Pneumococcal Polysaccharide-23 03/21/2018  . Tdap 06/01/2013  . Zoster Recombinat (Shingrix) 12/10/2017, 06/22/2018   We updated and reviewed the patient's past history in detail and it is documented below. Allergies: Patient has No Known Allergies. Past Medical History  has a past medical history of Arthritis, Chicken pox, Degenerative tear of left medial meniscus (12/10/2017), Diet-controlled diabetes mellitus (HCC) (12/10/2017), High triglycerides, Hyperlipidemia, Left lumbar radiculopathy (09/16/2017), and Lumbar herniated disc (09/16/2017). Past Surgical  History Patient  has a past surgical history that includes Tonsilectomy, adenoidectomy, bilateral myringotomy and tubes and Tracheostomy. Social History Patient  reports that he has quit smoking. He has never used smokeless tobacco. He reports previous alcohol use. He reports that he does not use drugs. Family History family history includes Arthritis in his father and maternal grandmother; Asthma in his mother; Cancer in his maternal grandfather, maternal grandmother, paternal grandfather, and paternal grandmother; Depression in his sister; Diabetes in his mother; Heart attack in his father; Heart disease in his father and paternal grandfather; Hyperlipidemia in his brother, father, and mother; Kidney disease in his mother; Mental illness in his sister; Miscarriages / Stillbirths in his mother; Stroke in his mother. Review of Systems: Constitutional: negative for fever or malaise Ophthalmic: negative for photophobia, double vision or loss of vision Cardiovascular: negative for chest pain, dyspnea on exertion,  or new LE swelling Respiratory: negative for SOB or persistent cough Gastrointestinal: negative for abdominal pain, change in bowel habits or melena Genitourinary: negative for dysuria or gross hematuria Musculoskeletal: negative for new gait disturbance or muscular weakness Integumentary: negative for new or persistent rashes Neurological: negative for TIA or stroke symptoms Psychiatric: negative for SI or delusions Allergic/Immunologic: negative for hives  Patient Care Team    Relationship Specialty Notifications Start End  Willow Ora, MD PCP - General Family Medicine  09/16/17   Christia Reading, MD Consulting Physician Otolaryngology  09/16/17   Ollen Gross, MD Consulting Physician Orthopedic Surgery  09/16/17   Marzella Schlein., MD  Ophthalmology  09/16/17   Dr. Ubaldo Glassing    09/16/17    Comment: Dentist   Objective  Vitals: BP 126/80   Pulse 78   Temp 97.7 F (36.5 C)  (Temporal)   Ht 5\' 8"  (1.727 m)   Wt 222 lb 12.8 oz (101.1 kg)   SpO2 99%   BMI 33.88 kg/m  General:  Well developed, well nourished, no acute distress  Psych:  Alert and orientedx3,normal mood and affect HEENT:  Normocephalic, atraumatic, non-icteric sclera, PERRL, oropharynx is clear without mass or exudate, supple neck without adenopathy, mass or thyromegaly, right ear canal is swollen and eczematous with scabbed lesion present.  TMs are normal bilaterally Cardiovascular:  Normal S1, S2, RRR without gallop, rub or murmur, nondisplaced PMI, +2 distal pulses in bilateral upper and lower extremities. Respiratory:  Good breath sounds bilaterally, CTAB with normal respiratory effort Gastrointestinal: normal bowel sounds, soft, non-tender, no noted masses. No HSM MSK: no deformities, contusions. Joints are without erythema or swelling. Spine and CVA region are nontender Skin:  Warm, eczematous rash on the left upper inner forearm no suspicious lesions noted Neurologic:    Mental status is normal. CN 2-11 are normal. Gross motor and sensory exams are normal. Stable gait. No tremor GU: No inguinal hernias or adenopathy are appreciated bilaterally Diabetic Foot Exam: Appearance - no lesions, ulcers or calluses Skin - no sigificant pallor or erythema Monofilament testing - sensitive bilaterally in following locations:  Right - Great toe, medial, central, lateral ball and posterior foot intact  Left - Great toe, medial, central, lateral ball and posterior foot intact Pulses - +2 distally bilaterally  Assessment  1. Annual physical exam   2. Controlled type 2 diabetes mellitus without complication, without long-term current use of insulin (HCC)   3. Mixed hyperlipidemia   4. Lumbar herniated disc   5. Acute eczematoid otitis externa of right ear   6. Obesity (BMI 30.0-34.9)   7. Left lumbar radiculopathy      Plan  Male Wellness Visit:  Age appropriate Health Maintenance and Prevention  measures were discussed with patient. Included topics are cancer screening recommendations, ways to keep healthy (see AVS) including dietary and exercise recommendations, regular eye and dental care, use of seat belts, and avoidance of moderate alcohol use and tobacco use.  Screens are up-to-date  BMI: discussed patient's BMI and encouraged positive lifestyle modifications to help get to or maintain a target BMI.  HM needs and immunizations were addressed and ordered. See below for orders. See HM and immunization section for updates.  Immunizations are up-to-date  Routine labs and screening tests ordered including cmp, cbc and lipids where appropriate.  Discussed recommendations regarding Vit D and calcium supplementation (see AVS)  Chronic disease f/u and/or acute problem visit: (deemed necessary to be done in addition to the wellness visit):  Diabetes: Clinically well controlled.  Check lab work today.  No changes made.  Check urine studies.  And is up-to-date.  Eye exam up-to-date.  Hyperlipidemia and hypertension: Both been well controlled.  Continue current medications.  Check lab work.  No changes in medications today.  Back pain: Chronic and intermittent.  Managed behaviorally  Cortisporin otic drops for eczematous otitis media.  Can use allergy medicines as needed.  Avoid Q-tips.  Bleeding is from trauma.  Reassured.  Obesity: Weight is trending downwards.  Continue healthy diet  Follow up: 6 months hypertension and diabetes recheck  Commons side effects, risks, benefits, and alternatives for medications and treatment plan prescribed today were discussed, and the patient expressed understanding of the given instructions. Patient is instructed to call or message via MyChart if he/she has any questions or concerns regarding our treatment plan. No barriers to understanding were identified. We discussed Red Flag symptoms and signs in detail. Patient expressed understanding regarding what  to do in case of urgent or emergency type symptoms.   Medication list was reconciled, printed and provided to the patient in AVS. Patient instructions and summary information was reviewed with the patient as documented in the AVS. This note was prepared with assistance of Dragon voice recognition software. Occasional wrong-word or sound-a-like substitutions may have occurred due to the inherent limitations of voice recognition software  This visit occurred during the SARS-CoV-2 public health emergency.  Safety protocols were in place, including screening questions prior to the visit, additional usage of staff PPE, and extensive cleaning of exam room while observing appropriate contact time as indicated for disinfecting solutions.   Orders Placed This Encounter  Procedures  . CBC with Differential/Platelet  . COMPLETE METABOLIC PANEL WITH GFR  . Lipid panel  . Microalbumin / creatinine urine ratio   Meds ordered this encounter  Medications  . neomycin-polymyxin-hydrocortisone (CORTISPORIN) OTIC solution    Sig: Place 3 drops into the right ear 3 (three) times daily.    Dispense:  10 mL    Refill:  0

## 2020-04-24 NOTE — Progress Notes (Signed)
Please add on hgbA1c, dx: diabetes.  Thanks, Dr. Mardelle Matte '

## 2020-04-25 LAB — COMPLETE METABOLIC PANEL WITH GFR
AG Ratio: 1.7 (calc) (ref 1.0–2.5)
ALT: 25 U/L (ref 9–46)
AST: 23 U/L (ref 10–35)
Albumin: 4.2 g/dL (ref 3.6–5.1)
Alkaline phosphatase (APISO): 29 U/L — ABNORMAL LOW (ref 35–144)
BUN: 19 mg/dL (ref 7–25)
CO2: 30 mmol/L (ref 20–32)
Calcium: 9.7 mg/dL (ref 8.6–10.3)
Chloride: 102 mmol/L (ref 98–110)
Creat: 1.14 mg/dL (ref 0.70–1.25)
GFR, Est African American: 79 mL/min/{1.73_m2} (ref >=60)
GFR, Est Non African American: 68 mL/min/{1.73_m2} (ref >=60)
Globulin: 2.5 g/dL (calc) (ref 1.9–3.7)
Glucose, Bld: 113 mg/dL — ABNORMAL HIGH (ref 65–99)
Potassium: 4.6 mmol/L (ref 3.5–5.3)
Sodium: 138 mmol/L (ref 135–146)
Total Bilirubin: 0.6 mg/dL (ref 0.2–1.2)
Total Protein: 6.7 g/dL (ref 6.1–8.1)

## 2020-04-25 LAB — CBC WITH DIFFERENTIAL/PLATELET
Absolute Monocytes: 517 cells/uL (ref 200–950)
Basophils Absolute: 74 cells/uL (ref 0–200)
Basophils Relative: 0.9 %
Eosinophils Absolute: 418 cells/uL (ref 15–500)
Eosinophils Relative: 5.1 %
HCT: 47.3 % (ref 38.5–50.0)
Hemoglobin: 16 g/dL (ref 13.2–17.1)
Lymphs Abs: 2050 cells/uL (ref 850–3900)
MCH: 30.1 pg (ref 27.0–33.0)
MCHC: 33.8 g/dL (ref 32.0–36.0)
MCV: 88.9 fL (ref 80.0–100.0)
MPV: 10.7 fL (ref 7.5–12.5)
Monocytes Relative: 6.3 %
Neutro Abs: 5141 cells/uL (ref 1500–7800)
Neutrophils Relative %: 62.7 %
Platelets: 317 10*3/uL (ref 140–400)
RBC: 5.32 10*6/uL (ref 4.20–5.80)
RDW: 12.6 % (ref 11.0–15.0)
Total Lymphocyte: 25 %
WBC: 8.2 10*3/uL (ref 3.8–10.8)

## 2020-04-25 LAB — HEMOGLOBIN A1C W/OUT EAG: Hgb A1c MFr Bld: 6.2 % of total Hgb — ABNORMAL HIGH (ref <5.7)

## 2020-04-25 LAB — LIPID PANEL
Cholesterol: 157 mg/dL (ref <200)
HDL: 44 mg/dL (ref >=40)
LDL Cholesterol (Calc): 85 mg/dL (calc)
Non-HDL Cholesterol (Calc): 113 mg/dL (calc) (ref <130)
Total CHOL/HDL Ratio: 3.6 (calc) (ref <5.0)
Triglycerides: 181 mg/dL — ABNORMAL HIGH (ref <150)

## 2020-04-25 LAB — MICROALBUMIN / CREATININE URINE RATIO
Creatinine, Urine: 46 mg/dL (ref 20–320)
Microalb Creat Ratio: 17 mcg/mg creat (ref <30)
Microalb, Ur: 0.8 mg/dL

## 2020-05-03 ENCOUNTER — Telehealth: Payer: Self-pay

## 2020-05-03 NOTE — Telephone Encounter (Signed)
Patient is calling in about his lab results, asked for a call back.

## 2020-05-09 NOTE — Telephone Encounter (Signed)
See below

## 2020-05-09 NOTE — Telephone Encounter (Signed)
Crestor should not cause gynecomastia.  I recommend changing.

## 2020-05-09 NOTE — Telephone Encounter (Signed)
Pt called requesting results from his labs at end of November. Read to pt that Dr. Mardelle Matte wanted to change simvastatin to crestor. Pt states that he cannot remember if he has taken crestor before. Pt states in the past (maybe 10 years ago) he took either crestor or lipitor and it caused him to develop gyneocomastia as a side effect and that is why he changed to simvastatin. Pt wanted to know if Dr. Mardelle Matte knew if gynecomastia was a side effect of one of these drug before he changed to crestor. Please advise.

## 2020-05-10 ENCOUNTER — Other Ambulatory Visit: Payer: Self-pay

## 2020-05-10 MED ORDER — ROSUVASTATIN CALCIUM 20 MG PO TABS: 20.0000 mg | ORAL_TABLET | Freq: Every day | ORAL | 3 refills | Status: DC

## 2020-07-13 ENCOUNTER — Encounter: Payer: Self-pay | Admitting: Family Medicine

## 2020-07-15 ENCOUNTER — Telehealth: Payer: Self-pay

## 2020-07-15 NOTE — Telephone Encounter (Signed)
error 

## 2020-07-17 ENCOUNTER — Encounter: Payer: Self-pay | Admitting: Family Medicine

## 2020-07-23 ENCOUNTER — Encounter: Payer: Self-pay | Admitting: Family Medicine

## 2020-07-23 ENCOUNTER — Telehealth (INDEPENDENT_AMBULATORY_CARE_PROVIDER_SITE_OTHER): Payer: Managed Care, Other (non HMO) | Admitting: Family Medicine

## 2020-07-23 DIAGNOSIS — M23204 Derangement of unspecified medial meniscus due to old tear or injury, left knee: Secondary | ICD-10-CM

## 2020-07-23 DIAGNOSIS — U071 COVID-19: Secondary | ICD-10-CM

## 2020-07-23 DIAGNOSIS — M7022 Olecranon bursitis, left elbow: Secondary | ICD-10-CM | POA: Diagnosis not present

## 2020-07-23 DIAGNOSIS — E119 Type 2 diabetes mellitus without complications: Secondary | ICD-10-CM | POA: Diagnosis not present

## 2020-07-23 NOTE — Patient Instructions (Signed)
Please follow up as scheduled for your next visit with me: 10/21/2020   If you have any questions or concerns, please don't hesitate to send me a message via MyChart or call the office at [phone]. Thank you for visiting with [place] today! It's our pleasure caring for you.   Elbow Bursitis  Bursitis is swelling and pain at the tip of the elbow. This happens when fluid builds up in a sac under the skin (bursa). This may also be called olecranon bursitis. What are the causes? Elbow bursitis may be caused by:  Elbow injury, such as falling onto the elbow.  Leaning on hard surfaces for long periods of time.  Infection from an injury that breaks the skin near the elbow.  A bone growth (spur) that forms at the tip of the elbow.  A medical condition that causes inflammation, such as gout or rheumatoid arthritis. Sometimes the cause is not known. What are the signs or symptoms? The first sign of elbow bursitis is usually swelling at the tip of the elbow. This can grow to be about the size of a golf ball. Swelling may start suddenly or develop gradually. Other symptoms may include:  Pain when bending or leaning on the elbow.  Not being able to move the elbow normally. If bursitis is caused by an infection, you may have:  Redness, warmth, and tenderness of the elbow.  Drainage of pus from the swollen area over the elbow, if the skin breaks open. How is this diagnosed? This condition may be diagnosed based on:  Your symptoms and medical history.  Any recent injuries you have had.  A physical exam.  X-rays to check for a bone spur or fracture.  Draining fluid from the bursa to test it for infection.  Blood tests to rule out gout or rheumatoid arthritis. How is this treated? Treatment for elbow bursitis depends on the cause. Treatment may include:  Medicines. These may include: ? Over-the-counter medicines to relieve pain and inflammation. ? Antibiotic medicines. ? Injections of  anti-inflammatory medicines (steroids).  Draining fluid from the bursa.  Wrapping your elbow with a bandage.  Wearing elbow pads. If these treatments do not help, you may need surgery to remove the bursa. Follow these instructions at home: Medicines  Take over-the-counter and prescription medicines only as told by your health care provider.  If you were prescribed an antibiotic medicine, take it as told by your health care provider. Do not stop taking the antibiotic even if you start to feel better. Managing pain, stiffness, and swelling  If directed, put ice on your elbow: ? Put ice in a plastic bag. ? Place a towel between your skin and the bag. ? Leave the ice on for 20 minutes, 2-3 times a day.  If your bursitis is caused by an injury, rest your elbow and wear your bandage as told by your health care provider.  Use elbow pads or elbow wraps to cushion your elbow as needed.   General instructions  Avoid any activities that cause elbow pain. Ask your health care provider what activities are safe for you.  Keep all follow-up visits as told by your health care provider. This is important. Contact a health care provider if you have:  A fever.  Symptoms that do not get better with treatment.  Pain or swelling that: ? Gets worse. ? Goes away and then comes back.  Pus draining from your elbow. Get help right away if you have:  Trouble moving  your arm, hand, or fingers. Summary  Elbow bursitis is inflammation of the fluid-filled sac (bursa) between the tip of your elbow bone (olecranon) and your skin.  Treatment for elbow bursitis depends on the cause. It may include medicines to relieve pain and inflammation, antibiotic medicines, and draining fluid from your elbow.  Contact a health care provider if your symptoms do not get better with treatment, or if your symptoms go away and then come back. This information is not intended to replace advice given to you by your  health care provider. Make sure you discuss any questions you have with your health care provider. Document Revised: 11/22/2019 Document Reviewed: 11/22/2019 Elsevier Patient Education  2021 [employer].

## 2020-07-23 NOTE — Progress Notes (Signed)
Virtual Visit via Video Note  Subjective  CC:  Chief Complaint  Patient presents with  . Elbow Pain    Left elbow, Pain started on 2/12, left work early. Inflammation and hot to touch. Initially believed to be gout so he took left over Colchicine with no relief. Patient is currently COVID positive  . Covid Positive    Tested positive 2/12, 2/17, and 2/21 - wondering when he can return to work, dry cough     I connected with [name] on 07/23/20 at 10:00 AM EST by a video enabled telemedicine application and verified that I am speaking with the correct person using two identifiers. Location patient: Home Location provider: [facility] [address], Office Persons participating in the virtual visit: ALEK BORGES, Willow Ora, MD Adela Glimpse CMA  I discussed the limitations of evaluation and management by telemedicine and the availability of in person appointments. The patient expressed understanding and agreed to proceed.   Covid infection: Patient with COVID-19 infection, diagnosed initially February 12.  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  Had typical URI symptoms.  Overall is doing very well however has persistent cough, postnasal drainage at times feels lightheaded.  He is using Mucinex.  Wants to know when he can return to work.  He has retested himself and as of yesterday tested positive.  His wife was positive as well but she has recovered and has tested negative.  He is a Occupational hygienist.  No fevers, shortness of breath or chest pain  February 12, left elbow was red hot and tender to touch.  He thought it could be related to gout and started old colchicine without relief.  He denies pain with movement.  It only hurts if he leans on elbow or hits it on something.  The swelling has decreased.  He has had no associated fevers.  Left knee pain: Orthopedics recommended conservative management with intermittent NSAIDs.   He does have a degenerative tear of the left medial meniscus.  They are deferring surgery.  Type 2 diabetes has been well controlled however he has been monitoring his sugars.  Fastings are elevated up to 150.  Due to infection.   Assessment  1. COVID-19 virus infection   2. Olecranon bursitis of left elbow   3. Controlled type 2 diabetes mellitus without complication, without long-term current use of insulin (HCC)   4. Degenerative tear of left medial meniscus      Plan   Covid infection: Persistent drainage and cough: Recommend Mucinex DM.  He likely is no longer contagious and can cancel isolation.  However he cannot return to work until he feels that he is able to do his job.  Specifically, his equilibrium needs to be resolved.  We will continue to monitor.  He may use Delsym if needed for cough.  Olecranon bursitis of left elbow: Education given.  Ice, Aleve 500 twice daily for the next 1 to 2 weeks recommended.  Ace wrap if needed.  Unlikely to be gout.  Monitor blood sugars during infection.  Drink plenty of water.  Only mild elevations now.  Left knee pain: Icing and Aleve as needed  Follow up: As scheduled  10/21/2020  I reviewed the patients updated PMH, FH, and SocHx.    Patient Active Problem List   Diagnosis Date Noted  . Controlled type 2 diabetes mellitus without complication, without long-term current use of  insulin (HCC) 12/10/2017    Priority: High  . Mixed hyperlipidemia 09/16/2017    Priority: High  . Obesity (BMI 30.0-34.9) 09/16/2017    Priority: High  . Lumbar herniated disc 09/16/2017    Priority: Medium  . Left lumbar radiculopathy 09/16/2017    Priority: Medium  . Osteoarthritis, hand 09/16/2017    Priority: Medium  . Recurrent sinusitis 12/16/2015    Priority: Medium  . Degenerative tear of left medial meniscus 12/10/2017    Priority: Low  . Heat rash 09/16/2017    Priority: Low  . Non-seasonal allergic rhinitis 12/16/2015    Priority: Low    Current Meds  Medication Sig  . augmented betamethasone dipropionate (DIPROLENE-AF) 0.05 % cream APPLY A THIN FILM EXTERNALLY TO AFFECTED AREA TWICE A DAY AS NEEDED  . Coenzyme Q10 (CO Q 10) 10 MG CAPS Take by mouth.  . fenofibrate 160 MG tablet TAKE ONE TABLET BY MOUTH DAILY  . fluticasone (FLONASE) 50 MCG/ACT nasal spray Place 2 sprays into the nose daily.  Marland Kitchen ketoconazole (NIZORAL) 2 % cream Apply 1 application topically 2 (two) times daily as needed for irritation.  . metFORMIN (GLUCOPHAGE) 1000 MG tablet TAKE ONE TABLET BY MOUTH TWICE A DAY WITH MEALS  . montelukast (SINGULAIR) 10 MG tablet Take 1 tablet (10 mg total) by mouth at bedtime.  . Multiple Vitamins-Minerals (CENTRUM SILVER 50+MEN PO) Take by mouth.  . Multiple Vitamins-Minerals (OCUVITE ADULT 50+ PO) Take by mouth.  . Naproxen Sodium 220 MG CAPS Take by mouth.  . Omega-3 Fatty Acids (FISH OIL) 1000 MG CAPS Take by mouth.  Marland Kitchen OVER THE COUNTER MEDICATION Clear Lungs  . rosuvastatin (CRESTOR) 20 MG tablet Take 1 tablet (20 mg total) by mouth daily.  . [DISCONTINUED] neomycin-polymyxin-hydrocortisone (CORTISPORIN) OTIC solution Place 3 drops into the right ear 3 (three) times daily.    Allergies: Patient has No Known Allergies. Family History: Patient family history includes Arthritis in his father and maternal grandmother; Asthma in his mother; Cancer in his maternal grandfather, maternal grandmother, paternal grandfather, and paternal grandmother; Depression in his sister; Diabetes in his mother; Heart attack in his father; Heart disease in his father and paternal grandfather; Hyperlipidemia in his brother, father, and mother; Kidney disease in his mother; Mental illness in his sister; Miscarriages / Stillbirths in his mother; Stroke in his mother. Social History:  Patient  reports that he has quit smoking. He has never used smokeless tobacco. He reports previous alcohol use. He reports that he does not use drugs.  Review of  Systems: Constitutional: Negative for fever malaise or anorexia Cardiovascular: negative for chest pain Respiratory: negative for SOB or persistent cough Gastrointestinal: negative for abdominal pain  OBJECTIVE Vitals: There were no vitals taken for this visit. General: no acute distress , A&Ox3 Left elbow: Erythema and minimal swelling over olecranon, full range of motion without pain HEENT: Postnasal drainage present with congestion, no respiratory distress  Willow Ora, MD

## 2020-08-10 ENCOUNTER — Other Ambulatory Visit: Payer: Self-pay | Admitting: Family Medicine

## 2020-10-21 ENCOUNTER — Ambulatory Visit: Payer: Managed Care, Other (non HMO) | Admitting: Family Medicine

## 2020-10-23 ENCOUNTER — Ambulatory Visit: Payer: Managed Care, Other (non HMO) | Admitting: Family Medicine

## 2020-10-23 ENCOUNTER — Encounter: Payer: Self-pay | Admitting: Family Medicine

## 2020-10-23 ENCOUNTER — Other Ambulatory Visit: Payer: Self-pay

## 2020-10-23 VITALS — BP 130/88 | HR 75 | Temp 97.1°F | Ht 68.0 in | Wt 224.0 lb

## 2020-10-23 DIAGNOSIS — E782 Mixed hyperlipidemia: Secondary | ICD-10-CM | POA: Diagnosis not present

## 2020-10-23 DIAGNOSIS — L570 Actinic keratosis: Secondary | ICD-10-CM

## 2020-10-23 DIAGNOSIS — E119 Type 2 diabetes mellitus without complications: Secondary | ICD-10-CM | POA: Diagnosis not present

## 2020-10-23 DIAGNOSIS — E66811 Obesity, class 1: Secondary | ICD-10-CM

## 2020-10-23 DIAGNOSIS — E669 Obesity, unspecified: Secondary | ICD-10-CM | POA: Diagnosis not present

## 2020-10-23 LAB — COMPREHENSIVE METABOLIC PANEL
ALT: 24 U/L (ref 0–53)
AST: 23 U/L (ref 0–37)
Albumin: 4.4 g/dL (ref 3.5–5.2)
Alkaline Phosphatase: 32 U/L — ABNORMAL LOW (ref 39–117)
BUN: 17 mg/dL (ref 6–23)
CO2: 27 mEq/L (ref 19–32)
Calcium: 9.2 mg/dL (ref 8.4–10.5)
Chloride: 102 mEq/L (ref 96–112)
Creatinine, Ser: 1.05 mg/dL (ref 0.40–1.50)
GFR: 75.32 mL/min (ref >60.00)
Glucose, Bld: 145 mg/dL — ABNORMAL HIGH (ref 70–99)
Potassium: 4.1 mEq/L (ref 3.5–5.1)
Sodium: 137 mEq/L (ref 135–145)
Total Bilirubin: 0.7 mg/dL (ref 0.2–1.2)
Total Protein: 7.1 g/dL (ref 6.0–8.3)

## 2020-10-23 LAB — LIPID PANEL
Cholesterol: 133 mg/dL (ref 0–200)
HDL: 41.5 mg/dL (ref >39.00)
LDL Cholesterol: 60 mg/dL (ref 0–99)
NonHDL: 91.89
Total CHOL/HDL Ratio: 3
Triglycerides: 157 mg/dL — ABNORMAL HIGH (ref 0.0–149.0)
VLDL: 31.4 mg/dL (ref 0.0–40.0)

## 2020-10-23 LAB — POCT GLYCOSYLATED HEMOGLOBIN (HGB A1C): Hemoglobin A1C: 6.7 % — AB (ref 4.0–5.6)

## 2020-10-23 NOTE — Patient Instructions (Addendum)
Please return in 3 months for diabetes follow up  I will release your lab results to you on your MyChart account with further instructions. Please reply with any questions.  Work hard on eating a healthy diabetic diet and losing 2 to 5 pounds in the next 3 months.  If you have any questions or concerns, please don't hesitate to send me a message via MyChart or call the office at [phone]. Thank you for visiting with [place] today! It's our pleasure caring for you.   Actinic Keratosis An actinic keratosis is a precancerous growth on the skin. If there is more than one growth, the condition is called actinic keratoses. Actinic keratoses appear most often on areas of skin that get a lot of sun exposure, including the scalp, face, ears, lips, upper back, forearms, and the backs of the hands. If left untreated, these growths may develop into a skin cancer called squamous cell carcinoma. It is important to have all these growths checked by a health care provider to determine the best treatment approach. What are the causes? Actinic keratoses are caused by getting too much ultraviolet (UV) radiation from the sun or other UV light sources. What increases the risk? You are more likely to develop this condition if you:  Have light-colored skin and blue eyes.  Have blond or red hair.  Spend a lot of time in the sun.  Do not protect your skin from the sun when outdoors.  Are an older person. The risk of developing an actinic keratosis increases with age. What are the signs or symptoms? Actinic keratoses feel like scaly, rough spots of skin. Symptoms of this condition include growths that may:  Be as small as a pinhead or as big as a quarter.  Itch, hurt, or feel sensitive.  Be skin-colored, light tan, dark tan, pink, or a combination of any of these colors. In most cases, the growths become red.  Have a small piece of pink or gray skin (skin tag) growing from them. It may be easier to notice  actinic keratoses by feeling them, rather than seeing them. Sometimes, actinic keratoses disappear, but many reappear a few days to a few weeks later.   How is this diagnosed? This condition is usually diagnosed with a physical exam.  A tissue sample may be removed from the actinic keratosis and examined under a microscope (biopsy). How is this treated? If needed, this condition may be treated by:  Scraping off the actinic keratosis (curettage).  Freezing the actinic keratosis with liquid nitrogen (cryosurgery). This causes the growth to eventually fall off the skin.  Applying medicated creams or gels to destroy the cells in the growth.  Applying chemicals to the actinic keratosis to make the outer layers of skin peel off (chemical peel).  Using photodynamic therapy. In this procedure, medicated cream is applied to the actinic keratosis. This cream increases your skin's sensitivity to light. Then, a strong light is aimed at the actinic keratosis to destroy cells in the growth. Follow these instructions at home: Skin care  Apply cool, wet cloths (cool compresses) to the affected areas.  Do not scratch your skin.  Check your skin regularly for any growths, especially growths that: ? Start to itch or bleed. ? Change in size, shape, or color. Caring for the treated area  Keep the treated area clean and dry as told by your health care provider.  Do not apply any medicine, cream, or lotion to the treated area unless your health  care provider tells you to do that.  Do not pick at blisters or try to break them open. This can cause infection and scarring.  If you have red or irritated skin after treatment, follow instructions from your health care provider about how to take care of the treated area. Make sure you: ? Wash your hands with soap and water before you change your bandage (dressing). If soap and water are not available, use hand sanitizer. ? Change your dressing as told by your  health care provider.  If you have red or irritated skin after treatment, check your treated area every day for signs of infection. Check for: ? Redness, swelling, or pain. ? Fluid or blood. ? Warmth. ? Pus or a bad smell. General instructions  Take or apply over-the-counter and prescription medicines only as told by your health care provider.  Return to your normal activities as told by your health care provider. Ask your health care provider what activities are safe for you.  Have a skin exam done every year by a health care provider who is a skin specialist (dermatologist).  Keep all follow-up visits as told by your health care provider. This is important. Lifestyle  Do not use any products that contain nicotine or tobacco, such as cigarettes and e-cigarettes. If you need help quitting, ask your health care provider.  Take steps to protect your skin from the sun. ? Try to avoid the sun between 10:00 a.m. and 4:00 p.m. This is when the UV light is the strongest. ? Use a sunscreen or sunblock with SPF 30 (sun protection factor 30) or greater. ? Apply sunscreen before you are exposed to sunlight and reapply as often as directed by the instructions on the sunscreen container. ? Always wear sunglasses that have UV protection, and always wear a hat and clothing to protect your skin from sunlight. ? When possible, avoid medicines that increase your sensitivity to sunlight. ? Do not use tanning beds or other indoor tanning devices. Contact a health care provider if:  You notice any changes or new growths on your skin.  You have swelling, pain, or more redness around your treated area.  You have fluid or blood coming from your treated area.  Your treated area feels warm to the touch.  You have pus or a bad smell coming from your treated area.  You have a fever.  You have a blister that becomes large and painful. Summary  An actinic keratosis is a precancerous growth on the skin.  If there is more than one growth, the condition is called actinic keratoses. In some cases, if left untreated, these growths can develop into skin cancer.  Check your skin regularly for any growths, especially growths that start to itch or bleed, or change in size, shape, or color.  Take steps to protect your skin from the sun.  Contact a health care provider if you notice any changes or new growths on your skin.  Keep all follow-up visits as told by your health care provider. This is important. This information is not intended to replace advice given to you by your health care provider. Make sure you discuss any questions you have with your health care provider. Document Revised: 09/28/2017 Document Reviewed: 09/28/2017 Elsevier Patient Education  2021 [employer].

## 2020-10-23 NOTE — Progress Notes (Signed)
Subjective  CC:  Chief Complaint  Patient presents with  . Diabetes  . Hyperlipidemia    HPI: Karl [name] is a 65 y.o. male who presents to the office today for follow up of diabetes and problems listed above in the chief complaint.   Diabetes follow up: His diabetic control is reported as Worse.  He has noticed that since changing from simvastatin to Crestor that his fasting sugars are elevated now.  Averaging 130s.  He reports his diet is mostly good.  He did have a few vacations and ate differently during those times.  He denies symptoms of hyperglycemia.  He is consistent with his metformin 1000 twice daily. He denies exertional CP or SOB or symptomatic hypoglycemia. He denies foot sores or paresthesias.  His weight is up a few pounds.  hyperlipidemia: Tolerating Crestor 20 mg nightly now.  No adverse effects.  He has noted some mild elevations in his fasting sugars.  'Skin tear': Left forearm with lesion.  Now white and thickened.  No pain, bleeding   Wt Readings from Last 3 Encounters:  10/23/20 224 lb (101.6 kg)  04/23/20 222 lb 12.8 oz (101.1 kg)  10/12/19 228 lb 12.8 oz (103.8 kg)    BP Readings from Last 3 Encounters:  10/23/20 130/88  04/23/20 126/80  10/12/19 122/82    Assessment  1. Controlled type 2 diabetes mellitus without complication, without long-term current use of insulin (HCC)   2. Mixed hyperlipidemia   3. Obesity (BMI 30.0-34.9)   4. Actinic keratosis      Plan   Diabetes is currently Fairly well controlled.  However, A1c's are trending upwards.  May have worsened slightly due to increase statin intensity.  Education given.  He will work harder on being more strict with diabetic diet.  He will work on weight loss.  Continue metformin at 1000 twice daily.  Recheck in 3 months.  Can consider changing to over addition of SGLT2 inhibitor if continues to worsen.  We discussed this as well.  Hyperlipidemia: Check LDL lipid panel today on high  intensity statin.  Adjust dose if needed.  Goal LDL less than 70.  Recommend low-fat diet.  He will work on weight loss.  Actinic keratoses: Educated on diagnosis and prognosis.  Status post cryotherapy x2 today.  See procedure note below.  Routine care instructions given  Follow up: 3 months for follow-up diabetes. Orders Placed This Encounter  Procedures  . Lipid panel  . Comprehensive metabolic panel  . POCT HgB A1C   No orders of the defined types were placed in this encounter.     Immunization History  Administered Date(s) Administered  . Influenza Inj Mdck Quad Pf 03/08/2017, 01/27/2018  . Influenza, Quadrivalent, Recombinant, Inj, Pf 03/27/2020  . PFIZER(Purple Top)SARS-COV-2 Vaccination 08/15/2019, 09/01/2019, 03/20/2020  . Pneumococcal Polysaccharide-23 03/21/2018  . Tdap 06/01/2013  . Zoster Recombinat (Shingrix) 12/10/2017, 06/22/2018    Diabetes Related Lab Review: Lab Results  Component Value Date   HGBA1C 6.7 (A) 10/23/2020   HGBA1C 6.2 (H) 04/23/2020   HGBA1C 6.3 (A) 10/12/2019    Lab Results  Component Value Date   MICROALBUR 0.8 04/23/2020   Lab Results  Component Value Date   CREATININE 1.14 04/23/2020   BUN 19 04/23/2020   NA 138 04/23/2020   K 4.6 04/23/2020   CL 102 04/23/2020   CO2 30 04/23/2020   Lab Results  Component Value Date   CHOL 157 04/23/2020   CHOL 150 04/14/2019  CHOL 150 12/10/2017   Lab Results  Component Value Date   HDL 44 04/23/2020   HDL 40.90 04/14/2019   HDL 43.50 12/10/2017   Lab Results  Component Value Date   LDLCALC 85 04/23/2020   LDLCALC 77 04/14/2019   LDLCALC 74 12/10/2017   Lab Results  Component Value Date   TRIG 181 (H) 04/23/2020   TRIG 162.0 (H) 04/14/2019   TRIG 161.0 (H) 12/10/2017   Lab Results  Component Value Date   CHOLHDL 3.6 04/23/2020   CHOLHDL 4 04/14/2019   CHOLHDL 3 12/10/2017   No results found for: LDLDIRECT The 10-year ASCVD risk score Denman George DC Jr., et al., 2013) is:  18.6%   Values used to calculate the score:     Age: 46 years     Sex: Male     Is Non-Hispanic African American: No     Diabetic: Yes     Tobacco smoker: No     Systolic Blood Pressure: 130 mmHg     Is BP treated: No     HDL Cholesterol: 44 mg/dL     Total Cholesterol: 157 mg/dL I have reviewed the PMH, Fam and Soc history. Patient Active Problem List   Diagnosis Date Noted  . Controlled type 2 diabetes mellitus without complication, without long-term current use of insulin (HCC) 12/10/2017    Priority: High  . Mixed hyperlipidemia 09/16/2017    Priority: High  . Obesity (BMI 30.0-34.9) 09/16/2017    Priority: High  . Lumbar herniated disc 09/16/2017    Priority: Medium    L3,4,5 by MRI, NeuroSurgery   . Left lumbar radiculopathy 09/16/2017    Priority: Medium    Evaluated by NS and treated with PT, [place].    . Osteoarthritis, hand 09/16/2017    Priority: Medium  . Recurrent sinusitis 12/16/2015    Priority: Medium  . Degenerative tear of left medial meniscus 12/10/2017    Priority: Low    Dr. Despina Hick   . Heat rash 09/16/2017    Priority: Low    Recurring;    . Non-seasonal allergic rhinitis 12/16/2015    Priority: Low    Social History: Patient  reports that he has quit smoking. He has never used smokeless tobacco. He reports previous alcohol use. He reports that he does not use drugs.  Review of Systems: Ophthalmic: negative for eye pain, loss of vision or double vision Cardiovascular: negative for chest pain Respiratory: negative for SOB or persistent cough Gastrointestinal: negative for abdominal pain Genitourinary: negative for dysuria or gross hematuria MSK: negative for foot lesions Neurologic: negative for weakness or gait disturbance  Objective  Vitals: BP 130/88   Pulse 75   Temp (!) 97.1 F (36.2 C) (Temporal)   Ht 5\' 8"  (1.727 m)   Wt 224 lb (101.6 kg)   SpO2 98%   BMI 34.06 kg/m  General: well appearing, no acute distress  Psych:   Alert and oriented, normal mood and affect HEENT:  Normocephalic, atraumatic, moist mucous membranes, supple neck  Cardiovascular:  Nl S1 and S2, RRR without murmur, gallop or rub. no edema Respiratory:  Good breath sounds bilaterally, CTAB with normal effort, no rales Skin: Left forearm with 3 mm white hyperkeratotic lesion on the right base  Cryotherapy Procedure Note  Pre-operative Diagnosis: Actinic keratosis  Post-operative Diagnosis: Actinic keratosis  Locations: Left forearm  Indications: Precancerous lesion  Anesthesia: none  Procedure Details   Patient informed of risks (permanent scarring, infection, light or dark discoloration,  bleeding, infection, weakness, numbness and recurrence of the lesion) and benefits of the procedure and verbal informed consent obtained. Universal time out performed  The areas are treated with liquid nitrogen therapy, frozen until ice ball extended 2 mm beyond lesion, allowed to thaw, and treated again. The patient tolerated procedure well.  The patient was instructed on post-op care, warned that there may be blister formation, redness and pain. Recommend OTC analgesia as needed for pain.  Condition: Stable  Complications: none.   Diabetic education: ongoing education regarding chronic disease management for diabetes was given today. We continue to reinforce the ABC's of diabetic management: A1c (<7 or 8 dependent upon patient), tight blood pressure control, and cholesterol management with goal LDL < 100 minimally. We discuss diet strategies, exercise recommendations, medication options and possible side effects. At each visit, we review recommended immunizations and preventive care recommendations for diabetics and stress that good diabetic control can prevent other problems. See below for this patient's data.    Commons side effects, risks, benefits, and alternatives for medications and treatment plan prescribed today were discussed, and the  patient expressed understanding of the given instructions. Patient is instructed to call or message via MyChart if he/she has any questions or concerns regarding our treatment plan. No barriers to understanding were identified. We discussed Red Flag symptoms and signs in detail. Patient expressed understanding regarding what to do in case of urgent or emergency type symptoms.   Medication list was reconciled, printed and provided to the patient in AVS. Patient instructions and summary information was reviewed with the patient as documented in the AVS. This note was prepared with assistance of Dragon voice recognition software. Occasional wrong-word or sound-a-like substitutions may have occurred due to the inherent limitations of voice recognition software  This visit occurred during the SARS-CoV-2 public health emergency.  Safety protocols were in place, including screening questions prior to the visit, additional usage of staff PPE, and extensive cleaning of exam room while observing appropriate contact time as indicated for disinfecting solutions.

## 2021-01-31 ENCOUNTER — Encounter: Payer: Self-pay | Admitting: Physician Assistant

## 2021-01-31 ENCOUNTER — Other Ambulatory Visit: Payer: Self-pay

## 2021-01-31 ENCOUNTER — Ambulatory Visit: Payer: Managed Care, Other (non HMO) | Admitting: Family Medicine

## 2021-01-31 ENCOUNTER — Ambulatory Visit: Payer: Managed Care, Other (non HMO) | Admitting: Physician Assistant

## 2021-01-31 ENCOUNTER — Other Ambulatory Visit: Payer: Self-pay | Admitting: Family Medicine

## 2021-01-31 VITALS — BP 124/80 | HR 77 | Temp 97.3°F | Ht 68.0 in | Wt 220.0 lb

## 2021-01-31 DIAGNOSIS — E119 Type 2 diabetes mellitus without complications: Secondary | ICD-10-CM

## 2021-01-31 LAB — POCT GLYCOSYLATED HEMOGLOBIN (HGB A1C): Hemoglobin A1C: 7.6 % — AB (ref 4.0–5.6)

## 2021-01-31 MED ORDER — EMPAGLIFLOZIN 10 MG PO TABS: 10.0000 mg | ORAL_TABLET | Freq: Every day | ORAL | 0 refills | Status: DC

## 2021-01-31 NOTE — Patient Instructions (Addendum)
It was great to see you!  Continue metformin 1000 mg twice daily  Start jardiance 10 mg daily  Work on balanced snacks  Follow-up with Dr. Mardelle Matte in 3 months.  Take care,  Jarold Motto PA-C

## 2021-01-31 NOTE — Progress Notes (Signed)
Karl [name] is a 65 y.o. male here for a follow up of a pre-existing problem.   History of Present Illness:   Chief Complaint  Patient presents with   Diabetes    HPI  Diabetes 3 month follow-up. Current DM meds: metformin 1000 mg BID. Blood sugars at home are: not checked. Patient is compliant with medications. Denies: hypoglycemic or hyperglycemic episodes or symptoms. This patient's diabetes is complicated by HLD.  Busy lifestyle has caused him to skip meals and caused increased snacking at home. He has been taking care of his wife for health reasons and he is working on his masters..   Lab Results  Component Value Date   HGBA1C 7.6 (A) 01/31/2021     Past Medical History:  Diagnosis Date   Arthritis    Chicken pox    Degenerative tear of left medial meniscus 12/10/2017   Dr. Despina Hick   Diet-controlled diabetes mellitus (HCC) 12/10/2017   a1c 6.05 Jun 2016 and July 2019   High triglycerides    Hyperlipidemia    Left lumbar radiculopathy 09/16/2017   Evaluated by NS and treated with PT, [facility].    Lumbar herniated disc 09/16/2017   L3,4,5 by MRI, [facility]     Social History   Tobacco Use   Smoking status: Former   Smokeless tobacco: Never  Vaping Use   Vaping Use: Never used  Substance Use Topics   Alcohol use: Not Currently   Drug use: Never    Past Surgical History:  Procedure Laterality Date   TONSILECTOMY, ADENOIDECTOMY, BILATERAL MYRINGOTOMY AND TUBES     1964   TRACHEOSTOMY     1959    Family History  Problem Relation Age of Onset   Asthma Mother    Diabetes Mother    Hyperlipidemia Mother    Kidney disease Mother    Miscarriages / [place] Mother    Stroke Mother    Arthritis Father    Heart disease Father    Hyperlipidemia Father    Heart attack Father    Depression Sister    Mental illness Sister    Hyperlipidemia Brother    Arthritis Maternal Grandmother    Cancer Maternal Grandmother    Cancer Maternal Grandfather     Cancer Paternal Grandmother    Cancer Paternal Grandfather    Heart disease Paternal Grandfather     No Known Allergies  Current Medications:   Current Outpatient Medications:    augmented betamethasone dipropionate (DIPROLENE-AF) 0.05 % cream, APPLY A THIN FILM EXTERNALLY TO AFFECTED AREA TWICE A DAY AS NEEDED, Disp: 30 g, Rfl: 2   Coenzyme Q10 (CO Q 10) 10 MG CAPS, Take by mouth., Disp: , Rfl:    fenofibrate 160 MG tablet, TAKE ONE TABLET BY MOUTH DAILY, Disp: 90 tablet, Rfl: 1   fluticasone (FLONASE) 50 MCG/ACT nasal spray, Place 2 sprays into the nose daily., Disp: , Rfl:    ketoconazole (NIZORAL) 2 % cream, Apply 1 application topically 2 (two) times daily as needed for irritation., Disp: 30 g, Rfl: 2   metFORMIN (GLUCOPHAGE) 1000 MG tablet, TAKE ONE TABLET BY MOUTH TWICE A DAY WITH MEALS, Disp: 180 tablet, Rfl: 3   montelukast (SINGULAIR) 10 MG tablet, Take 1 tablet (10 mg total) by mouth at bedtime., Disp: 30 tablet, Rfl: 3   Multiple Vitamins-Minerals (CENTRUM SILVER 50+MEN PO), Take by mouth., Disp: , Rfl:    Multiple Vitamins-Minerals (OCUVITE ADULT 50+ PO), Take by mouth., Disp: , Rfl:  Naproxen Sodium 220 MG CAPS, Take by mouth., Disp: , Rfl:    Omega-3 Fatty Acids (FISH OIL) 1000 MG CAPS, Take by mouth., Disp: , Rfl:    OVER THE COUNTER MEDICATION, Clear Lungs, Disp: , Rfl:    OVER THE COUNTER MEDICATION, 1,500 mg 2 (two) times daily. Cumin, Disp: , Rfl:    rosuvastatin (CRESTOR) 20 MG tablet, Take 1 tablet (20 mg total) by mouth daily., Disp: 90 tablet, Rfl: 3   Review of Systems:   ROS Negative unless otherwise specified per HPI.  Vitals:   Vitals:   01/31/21 1045  BP: 124/80  Pulse: 77  Temp: (!) 97.3 F (36.3 C)  TempSrc: Temporal  SpO2: 98%  Weight: 220 lb (99.8 kg)  Height: 5\' 8"  (1.727 m)     Body mass index is 33.45 kg/m.  Physical Exam:   Physical Exam Vitals and nursing note reviewed.  Constitutional:      General: He is not in acute  distress.    Appearance: He is well-developed. He is not ill-appearing or toxic-appearing.  Cardiovascular:     Rate and Rhythm: Normal rate and regular rhythm.     Pulses: Normal pulses.     Heart sounds: Normal heart sounds, S1 normal and S2 normal.     Comments: No LE edema Pulmonary:     Effort: Pulmonary effort is normal.     Breath sounds: Normal breath sounds.  Skin:    General: Skin is warm and dry.  Neurological:     Mental Status: He is alert.     GCS: GCS eye subscore is 4. GCS verbal subscore is 5. GCS motor subscore is 6.  Psychiatric:        Speech: Speech normal.        Behavior: Behavior normal. Behavior is cooperative.   Results for orders placed or performed in visit on 01/31/21  POCT glycosylated hemoglobin (Hb A1C)  Result Value Ref Range   Hemoglobin A1C 7.6 (A) 4.0 - 5.6 %   HbA1c POC (<> result, manual entry)     HbA1c, POC (prediabetic range)     HbA1c, POC (controlled diabetic range)      Assessment and Plan:   1. Controlled type 2 diabetes mellitus without complication, without long-term current use of insulin (HCC) A1c has worsened from 6.7 to 7.6%. Per PCP prior recommendation, will start SGLT2 - Jardiance 10 mg daily. Nutrition recommendations discussed -- provided handout for healthier snack options. Follow-up with PCP in 3 months, sooner if concerns.  04/02/21, PA-C

## 2021-02-27 ENCOUNTER — Encounter: Payer: Self-pay | Admitting: Physician Assistant

## 2021-02-27 ENCOUNTER — Ambulatory Visit: Payer: Managed Care, Other (non HMO) | Admitting: Physician Assistant

## 2021-02-27 ENCOUNTER — Other Ambulatory Visit: Payer: Self-pay

## 2021-02-27 VITALS — BP 147/89 | HR 82 | Temp 98.2°F | Ht 68.0 in | Wt 223.2 lb

## 2021-02-27 DIAGNOSIS — R3121 Asymptomatic microscopic hematuria: Secondary | ICD-10-CM

## 2021-02-27 DIAGNOSIS — E119 Type 2 diabetes mellitus without complications: Secondary | ICD-10-CM

## 2021-02-27 DIAGNOSIS — Z23 Encounter for immunization: Secondary | ICD-10-CM

## 2021-02-27 LAB — POC URINALSYSI DIPSTICK (AUTOMATED)
Bilirubin, UA: NEGATIVE
Blood, UA: POSITIVE
Glucose, UA: POSITIVE — AB
Ketones, UA: NEGATIVE
Leukocytes, UA: NEGATIVE
Nitrite, UA: NEGATIVE
Protein, UA: NEGATIVE
Spec Grav, UA: 1.01 (ref 1.010–1.025)
Urobilinogen, UA: 0.2 E.U./dL
pH, UA: 7 (ref 5.0–8.0)

## 2021-02-27 NOTE — Patient Instructions (Signed)
Flu vaccine in office today  Push water this weekend Avoid caffeine  Abstinence Recheck urine on Monday  See attached handout for diabetes and nutrition

## 2021-02-27 NOTE — Progress Notes (Signed)
Subjective:    Patient ID: Karl [name], male    DOB: 08-22-1955, 65 y.o.   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  Patient states that he needs this test repeated.  He says this happened once before on his urine results after having intercourse and then it cleared spontaneously. Denies any urinary symptoms. No abdominal pain. No fever / chills. No recent illness.  No hx of kidney stones. Smoked cigarettes a little bit in high school and into young 20s, but never heavy smoker.   Patient states that his medical examiner is also wanting him to work on losing weight and bringing his hemoglobin A1c down without any additional diabetes medications.  He is asking for some information on diabetes friendly diets.  States that he works 8 days on and 6 days off and he has a hard time with being consistent in his nutrition.  Past Medical History:  Diagnosis Date   Arthritis    Chicken pox    Degenerative tear of left medial meniscus 12/10/2017   Dr. Despina Hick   Diet-controlled diabetes mellitus (HCC) 12/10/2017   a1c 6.05 Jun 2016 and July 2019   High triglycerides    Hyperlipidemia    Left lumbar radiculopathy 09/16/2017   Evaluated by NS and treated with PT, [facility].    Lumbar herniated disc 09/16/2017   L3,4,5 by MRI, [facility]    Past Surgical History:  Procedure Laterality Date   TONSILECTOMY, ADENOIDECTOMY, BILATERAL MYRINGOTOMY AND TUBES     1964   TRACHEOSTOMY     1959    Family History  Problem Relation Age of Onset   Asthma Mother    Diabetes Mother    Hyperlipidemia Mother    Kidney disease Mother    Miscarriages / [place] Mother    Stroke Mother    Arthritis Father    Heart disease Father    Hyperlipidemia Father    Heart attack Father    Depression Sister    Mental illness Sister    Hyperlipidemia Brother    Arthritis Maternal  Grandmother    Cancer Maternal Grandmother    Cancer Maternal Grandfather    Cancer Paternal Grandmother    Cancer Paternal Grandfather    Heart disease Paternal Grandfather     Social History   Tobacco Use   Smoking status: Former   Smokeless tobacco: Never  Building services engineer Use: Never used  Substance Use Topics   Alcohol use: Not Currently   Drug use: Never     No Known Allergies  Review of Systems REFER TO HPI FOR PERTINENT POSITIVES AND NEGATIVES      Objective:     BP (!) 147/89   Pulse 82   Temp 98.2 F (36.8 C)   Ht 5\' 8"  (1.727 m)   Wt 223 lb 3.2 oz (101.2 kg)   SpO2 98%   BMI 33.94 kg/m   Wt Readings from Last 3 Encounters:  02/27/21 223 lb 3.2 oz (101.2 kg)  01/31/21 220 lb (99.8 kg)  10/23/20 224 lb (101.6 kg)    BP Readings from Last 3 Encounters:  02/27/21 (!) 147/89  01/31/21 124/80  10/23/20 130/88     Physical Exam Vitals and nursing note reviewed.  Constitutional:      General: He is not in acute distress.    Appearance: Normal  appearance. He is obese. He is not toxic-appearing.  HENT:     Head: Normocephalic and atraumatic.     Right Ear: External ear normal.     Left Ear: External ear normal.     Nose: Nose normal.     Mouth/Throat:     Mouth: Mucous membranes are moist.     Pharynx: Oropharynx is clear.  Eyes:     Extraocular Movements: Extraocular movements intact.     Conjunctiva/sclera: Conjunctivae normal.     Pupils: Pupils are equal, round, and reactive to light.  Cardiovascular:     Rate and Rhythm: Normal rate and regular rhythm.     Pulses: Normal pulses.     Heart sounds: Normal heart sounds.  Pulmonary:     Effort: Pulmonary effort is normal.     Breath sounds: Normal breath sounds.  Abdominal:     Tenderness: There is no abdominal tenderness. There is no right CVA tenderness or left CVA tenderness.  Musculoskeletal:        General: Normal range of motion.     Cervical back: Normal range of motion and  neck supple.  Skin:    General: Skin is warm and dry.  Neurological:     General: No focal deficit present.     Mental Status: He is alert and oriented to person, place, and time.  Psychiatric:        Mood and Affect: Mood normal.        Behavior: Behavior normal.       Assessment & Plan:   Problem List Items Addressed This Visit       Endocrine   Controlled type 2 diabetes mellitus without complication, without long-term current use of insulin (HCC)   Other Visit Diagnoses     Asymptomatic microscopic hematuria    -  Primary   Relevant Orders   POCT Urinalysis Dipstick (Automated) (Completed)       1. Asymptomatic microscopic hematuria - 1+ blood on urinalysis noted today - No symptoms - He has been sexually active within the last 48 hours (? semen causation) - Recommended recheck next week & if persistent, go through further work-up - He will push water & avoid caffeine this weekend as well  2. Controlled type 2 diabetes mellitus without complication, without long-term current use of insulin (HCC) - Handout provided in AVS to help with T2DM and nutrition - Advised to limit carbs such as breads, pastas, potato chips   This note was prepared with assistance of Dragon voice recognition software. Occasional wrong-word or sound-a-like substitutions may have occurred due to the inherent limitations of voice recognition software.  Time Spent: 21 minutes of total time was spent on the date of the encounter performing the following actions: chart review prior to seeing the patient, obtaining history, performing a medically necessary exam, counseling on the treatment plan, placing orders, and documenting in our EHR.    Sorina Derrig M Jameika Kinn, PA-C

## 2021-03-03 ENCOUNTER — Other Ambulatory Visit: Payer: Self-pay

## 2021-03-03 ENCOUNTER — Ambulatory Visit: Payer: Managed Care, Other (non HMO) | Admitting: Family Medicine

## 2021-03-03 ENCOUNTER — Encounter: Payer: Self-pay | Admitting: Family Medicine

## 2021-03-03 VITALS — BP 148/90 | HR 91 | Temp 97.9°F | Ht 68.0 in | Wt 216.8 lb

## 2021-03-03 DIAGNOSIS — R030 Elevated blood-pressure reading, without diagnosis of hypertension: Secondary | ICD-10-CM

## 2021-03-03 DIAGNOSIS — E1169 Type 2 diabetes mellitus with other specified complication: Secondary | ICD-10-CM | POA: Diagnosis not present

## 2021-03-03 DIAGNOSIS — R8290 Unspecified abnormal findings in urine: Secondary | ICD-10-CM

## 2021-03-03 DIAGNOSIS — E1165 Type 2 diabetes mellitus with hyperglycemia: Secondary | ICD-10-CM

## 2021-03-03 DIAGNOSIS — E669 Obesity, unspecified: Secondary | ICD-10-CM | POA: Diagnosis not present

## 2021-03-03 DIAGNOSIS — E782 Mixed hyperlipidemia: Secondary | ICD-10-CM

## 2021-03-03 LAB — POCT GLYCOSYLATED HEMOGLOBIN (HGB A1C): Hemoglobin A1C: 7.1 % — AB (ref 4.0–5.6)

## 2021-03-03 MED ORDER — BETAMETHASONE DIPROPIONATE AUG 0.05 % EX CREA: TOPICAL_CREAM | CUTANEOUS | 2 refills | Status: DC

## 2021-03-03 MED ORDER — KETOCONAZOLE 2 % EX CREA: 1.0000 "application " | TOPICAL_CREAM | Freq: Two times a day (BID) | CUTANEOUS | 2 refills | Status: DC | PRN

## 2021-03-03 NOTE — Progress Notes (Signed)
Subjective  CC:  Chief Complaint  Patient presents with   Hematuria    HPI: Karl [name] is a 65 y.o. male who presents to the office today for follow up of diabetes and problems listed above in the chief complaint.  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 Dip was rechecked here with +1 blood but microscopy was not done. Pt is asymptomatic.  Diabetes follow up: started jardiance last month for elevating a1c. On met 1000 bid as well.  Obesity: losing weight with diet changes and jardiance.  Elevated blood pressure: home readings are normal. He will start checking again. 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   Wt Readings from Last 3 Encounters:  03/03/21 216 lb 12.8 oz (98.3 kg)  02/27/21 223 lb 3.2 oz (101.2 kg)  01/31/21 220 lb (99.8 kg)    BP Readings from Last 3 Encounters:  03/03/21 (!) 148/90  02/27/21 (!) 147/89  01/31/21 124/80    Assessment  1. Abnormal urine finding   2. Uncontrolled type 2 diabetes mellitus with hyperglycemia, without long-term current use of insulin ([place])   3. Combined hyperlipidemia associated with type 2 diabetes mellitus (HCC)   4. Obesity (BMI 30.0-34.9)   5. Elevated blood pressure reading without diagnosis of hypertension      Plan  Diabetes is currently marginally controlled. Trending downward now with the addition of jardiance. Fastings are improved as well. Conintue met and jardiance and recheck in December.  Elevated bp: to start monitoring at home. Reports it was normal at recent [place] visit Abnl dipstick urine: check microscopy to clarify if microscopic hematuria or not. Further eval pending results. Education given. Check culture.  Obesity: improving diet. Jardiance should also help.  HLD on statin. Controlled.   Follow up: as scheduled for cpe. Orders Placed This Encounter  Procedures   Urine Culture   Urinalysis, Routine w reflex microscopic    Microalbumin / creatinine urine ratio   POCT HgB A1C   Meds ordered this encounter  Medications   augmented betamethasone dipropionate (DIPROLENE-AF) 0.05 % cream    Sig: APPLY A THIN FILM EXTERNALLY TO AFFECTED AREA TWICE A DAY AS NEEDED    Dispense:  30 g    Refill:  2   ketoconazole (NIZORAL) 2 % cream    Sig: Apply 1 application topically 2 (two) times daily as needed for irritation.    Dispense:  30 g    Refill:  2      Immunization History  Administered Date(s) Administered   Influenza Inj Mdck Quad Pf 03/08/2017, 01/27/2018   Influenza, Quadrivalent, Recombinant, Inj, Pf 03/27/2020   Influenza,inj,Quad PF,6+ Mos 02/27/2021   PFIZER(Purple Top)SARS-COV-2 Vaccination 08/15/2019, 09/01/2019, 03/20/2020   Pneumococcal Polysaccharide-23 03/21/2018   Tdap 06/01/2013   Zoster Recombinat (Shingrix) 12/10/2017, 06/22/2018    Diabetes Related Lab Review: Lab Results  Component Value Date   HGBA1C 7.1 (A) 03/03/2021   HGBA1C 7.6 (A) 01/31/2021   HGBA1C 6.7 (A) 10/23/2020    Lab Results  Component Value Date   MICROALBUR 0.8 04/23/2020   Lab Results  Component Value Date   CREATININE 1.05 10/23/2020   BUN 17 10/23/2020   NA 137 10/23/2020   K 4.1 10/23/2020   CL 102 10/23/2020   CO2 27 10/23/2020   Lab Results  Component Value Date   CHOL 133 10/23/2020   CHOL 157 04/23/2020   CHOL 150 04/14/2019  Lab Results  Component Value Date   HDL 41.50 10/23/2020   HDL 44 04/23/2020   HDL 40.90 04/14/2019   Lab Results  Component Value Date   LDLCALC 60 10/23/2020   LDLCALC 85 04/23/2020   LDLCALC 77 04/14/2019   Lab Results  Component Value Date   TRIG 157.0 (H) 10/23/2020   TRIG 181 (H) 04/23/2020   TRIG 162.0 (H) 04/14/2019   Lab Results  Component Value Date   CHOLHDL 3 10/23/2020   CHOLHDL 3.6 04/23/2020   CHOLHDL 4 04/14/2019   No results found for: LDLDIRECT The 10-year ASCVD risk score (Arnett DK, et al., 2019) is: 22.7%   Values used to  calculate the score:     Age: 36 years     Sex: Male     Is Non-Hispanic African American: No     Diabetic: Yes     Tobacco smoker: No     Systolic Blood Pressure: 468 mmHg     Is BP treated: No     HDL Cholesterol: 41.5 mg/dL     Total Cholesterol: 133 mg/dL I have reviewed the PMH, Fam and Soc history. Patient Active Problem List   Diagnosis Date Noted   Controlled type 2 diabetes mellitus without complication, without long-term current use of insulin ([place]) 12/10/2017    Priority: 1.   Mixed hyperlipidemia 09/16/2017    Priority: 1.   Obesity (BMI 30.0-34.9) 09/16/2017    Priority: 1.   Lumbar herniated disc 09/16/2017    Priority: 2.    L3,4,5 by MRI, [facility]    Left lumbar radiculopathy 09/16/2017    Priority: 2.    Evaluated by NS and treated with PT, [facility].     Osteoarthritis, hand 09/16/2017    Priority: 2.   Recurrent sinusitis 12/16/2015    Priority: 2.   Degenerative tear of left medial meniscus 12/10/2017    Priority: 3.    Dr. Imagene Riches rash 09/16/2017    Priority: 3.    Recurring;     Non-seasonal allergic rhinitis 12/16/2015    Priority: 3.    Social History: Patient  reports that he has quit smoking. He has never used smokeless tobacco. He reports that he does not currently use alcohol. He reports that he does not use drugs.  Review of Systems: Ophthalmic: negative for eye pain, loss of vision or double vision Cardiovascular: negative for chest pain Respiratory: negative for SOB or persistent cough Gastrointestinal: negative for abdominal pain Genitourinary: negative for dysuria or gross hematuria MSK: negative for foot lesions Neurologic: negative for weakness or gait disturbance  Objective  Vitals: BP (!) 148/90   Pulse 91   Temp 97.9 F (36.6 C) (Temporal)   Ht 5' 8"  (1.727 m)   Wt 216 lb 12.8 oz (98.3 kg)   SpO2 96%   BMI 32.96 kg/m  General: well appearing, no acute distress  Psych:  Alert and oriented, normal mood  and affect Office Visit on 03/03/2021  Component Date Value Ref Range Status   Hemoglobin A1C 03/03/2021 7.1 (A) 4.0 - 5.6 % Final   Urine dip w/ +1 blood today,   Diabetic education: ongoing education regarding chronic disease management for diabetes was given today. We continue to reinforce the ABC's of diabetic management: A1c (<7 or 8 dependent upon patient), tight blood pressure control, and cholesterol management with goal LDL < 100 minimally. We discuss diet strategies, exercise recommendations, medication options and possible side effects. At each visit,  we review recommended immunizations and preventive care recommendations for diabetics and stress that good diabetic control can prevent other problems. See below for this patient's data.   Commons side effects, risks, benefits, and alternatives for medications and treatment plan prescribed today were discussed, and the patient expressed understanding of the given instructions. Patient is instructed to call or message via MyChart if he/she has any questions or concerns regarding our treatment plan. No barriers to understanding were identified. We discussed Red Flag symptoms and signs in detail. Patient expressed understanding regarding what to do in case of urgent or emergency type symptoms.  Medication list was reconciled, printed and provided to the patient in AVS. Patient instructions and summary information was reviewed with the patient as documented in the AVS. This note was prepared with assistance of Dragon voice recognition software. Occasional wrong-word or sound-a-like substitutions may have occurred due to the inherent limitations of voice recognition software  This visit occurred during the SARS-CoV-2 public health emergency.  Safety protocols were in place, including screening questions prior to the visit, additional usage of staff PPE, and extensive cleaning of exam room while observing appropriate contact time as indicated for  disinfecting solutions.

## 2021-03-03 NOTE — Patient Instructions (Signed)
Please follow up as scheduled for your next visit with me: 05/09/2021   I will release your lab results to you on your MyChart account with further instructions. Please reply with any questions.    Monitor your blood pressures at home to ensure they are not staying high.   If you have any questions or concerns, please don't hesitate to send me a message via MyChart or call the office at [phone]. Thank you for visiting with Karl [name] today! It's our pleasure caring for you.

## 2021-03-04 ENCOUNTER — Encounter: Payer: Self-pay | Admitting: Family Medicine

## 2021-03-04 LAB — MICROALBUMIN / CREATININE URINE RATIO
Creatinine,U: 59.7 mg/dL
Microalb Creat Ratio: 2.8 mg/g (ref 0.0–30.0)
Microalb, Ur: 1.7 mg/dL (ref 0.0–1.9)

## 2021-03-04 LAB — URINALYSIS, ROUTINE W REFLEX MICROSCOPIC
Bilirubin Urine: NEGATIVE
Ketones, ur: NEGATIVE
Leukocytes,Ua: NEGATIVE
Nitrite: NEGATIVE
Specific Gravity, Urine: 1.015 (ref 1.000–1.030)
Total Protein, Urine: NEGATIVE
Urine Glucose: >=1000 — AB
Urobilinogen, UA: 1 (ref 0.0–1.0)
pH: 7 (ref 5.0–8.0)

## 2021-03-04 LAB — URINE CULTURE
MICRO NUMBER:: 12452263
Result:: NO GROWTH
SPECIMEN QUALITY:: ADEQUATE

## 2021-03-05 ENCOUNTER — Encounter: Payer: Self-pay | Admitting: Family Medicine

## 2021-03-05 ENCOUNTER — Other Ambulatory Visit: Payer: Self-pay

## 2021-03-05 DIAGNOSIS — R3129 Other microscopic hematuria: Secondary | ICD-10-CM

## 2021-05-07 LAB — PSA
PSA: 0.66
PSA: 0.66

## 2021-05-09 ENCOUNTER — Encounter: Payer: Self-pay | Admitting: Family Medicine

## 2021-05-09 ENCOUNTER — Ambulatory Visit: Payer: Managed Care, Other (non HMO) | Admitting: Family Medicine

## 2021-05-09 ENCOUNTER — Other Ambulatory Visit: Payer: Self-pay

## 2021-05-09 VITALS — BP 126/80 | HR 79 | Temp 97.7°F | Ht 68.0 in | Wt 213.0 lb

## 2021-05-09 DIAGNOSIS — Z6834 Body mass index (BMI) 34.0-34.9, adult: Secondary | ICD-10-CM | POA: Insufficient documentation

## 2021-05-09 DIAGNOSIS — E6609 Other obesity due to excess calories: Secondary | ICD-10-CM

## 2021-05-09 DIAGNOSIS — E782 Mixed hyperlipidemia: Secondary | ICD-10-CM

## 2021-05-09 DIAGNOSIS — E119 Type 2 diabetes mellitus without complications: Secondary | ICD-10-CM

## 2021-05-09 DIAGNOSIS — R3129 Other microscopic hematuria: Secondary | ICD-10-CM | POA: Diagnosis not present

## 2021-05-09 DIAGNOSIS — Z0000 Encounter for general adult medical examination without abnormal findings: Secondary | ICD-10-CM

## 2021-05-09 DIAGNOSIS — E662 Morbid (severe) obesity with alveolar hypoventilation: Secondary | ICD-10-CM | POA: Insufficient documentation

## 2021-05-09 DIAGNOSIS — Z23 Encounter for immunization: Secondary | ICD-10-CM

## 2021-05-09 DIAGNOSIS — Z6832 Body mass index (BMI) 32.0-32.9, adult: Secondary | ICD-10-CM

## 2021-05-09 HISTORY — DX: Other microscopic hematuria: R31.29

## 2021-05-09 LAB — CBC WITH DIFFERENTIAL/PLATELET
Basophils Absolute: 0.1 10*3/uL (ref 0.0–0.1)
Basophils Relative: 0.6 % (ref 0.0–3.0)
Eosinophils Absolute: 0.4 10*3/uL (ref 0.0–0.7)
Eosinophils Relative: 4.5 % (ref 0.0–5.0)
HCT: 48.2 % (ref 39.0–52.0)
Hemoglobin: 16 g/dL (ref 13.0–17.0)
Lymphocytes Relative: 21.7 % (ref 12.0–46.0)
Lymphs Abs: 1.9 10*3/uL (ref 0.7–4.0)
MCHC: 33.1 g/dL (ref 30.0–36.0)
MCV: 87.9 fl (ref 78.0–100.0)
Monocytes Absolute: 0.5 10*3/uL (ref 0.1–1.0)
Monocytes Relative: 6.3 % (ref 3.0–12.0)
Neutro Abs: 5.8 10*3/uL (ref 1.4–7.7)
Neutrophils Relative %: 66.9 % (ref 43.0–77.0)
Platelets: 282 10*3/uL (ref 150.0–400.0)
RBC: 5.48 Mil/uL (ref 4.22–5.81)
RDW: 13.4 % (ref 11.5–15.5)
WBC: 8.7 10*3/uL (ref 4.0–10.5)

## 2021-05-09 LAB — COMPREHENSIVE METABOLIC PANEL
ALT: 23 U/L (ref 0–53)
AST: 24 U/L (ref 0–37)
Albumin: 4.4 g/dL (ref 3.5–5.2)
Alkaline Phosphatase: 32 U/L — ABNORMAL LOW (ref 39–117)
BUN: 23 mg/dL (ref 6–23)
CO2: 28 mEq/L (ref 19–32)
Calcium: 9.9 mg/dL (ref 8.4–10.5)
Chloride: 101 mEq/L (ref 96–112)
Creatinine, Ser: 1.16 mg/dL (ref 0.40–1.50)
GFR: 66.58 mL/min (ref >60.00)
Glucose, Bld: 112 mg/dL — ABNORMAL HIGH (ref 70–99)
Potassium: 4.2 mEq/L (ref 3.5–5.1)
Sodium: 138 mEq/L (ref 135–145)
Total Bilirubin: 0.6 mg/dL (ref 0.2–1.2)
Total Protein: 7.2 g/dL (ref 6.0–8.3)

## 2021-05-09 LAB — LIPID PANEL
Cholesterol: 128 mg/dL (ref 0–200)
HDL: 48.6 mg/dL (ref >39.00)
LDL Cholesterol: 43 mg/dL (ref 0–99)
NonHDL: 79.8
Total CHOL/HDL Ratio: 3
Triglycerides: 182 mg/dL — ABNORMAL HIGH (ref 0.0–149.0)
VLDL: 36.4 mg/dL (ref 0.0–40.0)

## 2021-05-09 LAB — HEMOGLOBIN A1C: Hgb A1c MFr Bld: 6.3 % (ref 4.6–6.5)

## 2021-05-09 LAB — TSH: TSH: 1.74 u[IU]/mL (ref 0.35–5.50)

## 2021-05-09 MED ORDER — ROSUVASTATIN CALCIUM 20 MG PO TABS: 20.0000 mg | ORAL_TABLET | Freq: Every day | ORAL | 3 refills | Status: DC

## 2021-05-09 MED ORDER — EMPAGLIFLOZIN 10 MG PO TABS: 10.0000 mg | ORAL_TABLET | Freq: Every day | ORAL | 3 refills | Status: DC

## 2021-05-09 NOTE — Addendum Note (Signed)
Addended by: Katharine Look on: 05/09/2021 11:01 AM   Modules accepted: Orders

## 2021-05-09 NOTE — Progress Notes (Signed)
Subjective  Chief Complaint  Patient presents with   Hematuria   Hypertension    HPI: Karl [name] is a 65 y.o. male who presents to [facility] today for a Male Wellness Visit. He also has the concerns and/or needs as listed above in the chief complaint. These will be addressed in addition to the Health Maintenance Visit.   Wellness Visit: annual visit with health maintenance review and exam   Health maintenance: Screens are current and up-to-date.  Eligible for Prevnar 20 today to complete his pneumococcal vaccination series.  Eligible for fourth COVID booster  Body mass index is 32.39 kg/m. Wt Readings from Last 3 Encounters:  05/09/21 213 lb (96.6 kg)  03/03/21 216 lb 12.8 oz (98.3 kg)  02/27/21 223 lb 3.2 oz (101.2 kg)     Chronic disease management visit and/or acute problem visit: Diabetes: Continues on Farxiga 10 and metformin 1000 twice daily.  Tolerates well.  Fasting sugars are well controlled with average around 100-110.  He denies symptoms of hyperglycemia or hypoglycemia.  No foot paresthesias. Hyperlipidemia has been well controlled on statin.  Continues to tolerate well.  Weight continues to trend downward.  Diet is good. Microscopic hematuria: Reviewed urology notes from this week.  Most recent urine analysis was negative at urology office.  Will undergo CT IVP and possibly cystoscopy.  He has no questions.  No gross hematuria or irritative symptoms now. Osteoarthritis: Stable.  No longer using NSAIDs  Patient Active Problem List   Diagnosis Date Noted   Controlled type 2 diabetes mellitus without complication, without long-term current use of insulin ([place]) 12/10/2017   Mixed hyperlipidemia 09/16/2017   Obesity (BMI 30.0-34.9) 09/16/2017   Lumbar herniated disc 09/16/2017   Left lumbar radiculopathy 09/16/2017   Osteoarthritis, hand 09/16/2017   Recurrent sinusitis 12/16/2015   Degenerative tear of left medial meniscus 12/10/2017    Heat rash 09/16/2017   Non-seasonal allergic rhinitis 12/16/2015   Microscopic hematuria 05/09/2021   Class 1 obesity with alveolar hypoventilation, serious comorbidity, and body mass index (BMI) of 34.0 to 34.9 in adult [facility]) 05/09/2021   Health Maintenance  Topic Date Due   Pneumococcal Vaccine 52-3 Years old (2 - PCV) 03/22/2019   COVID-19 Vaccine (4 - Booster for Pfizer series) 05/15/2020   OPHTHALMOLOGY EXAM  08/08/2021   HEMOGLOBIN A1C  09/01/2021   URINE MICROALBUMIN  03/03/2022   FOOT EXAM  05/09/2022   TETANUS/TDAP  06/02/2023   COLONOSCOPY (Pts 45-39yrs Insurance coverage will need to be confirmed)  01/31/2027   INFLUENZA VACCINE  Completed   Hepatitis C Screening  Completed   HIV Screening  Completed   Zoster Vaccines- Shingrix  Completed   HPV VACCINES  Aged Out   Immunization History  Administered Date(s) Administered   Influenza Inj Mdck Quad Pf 03/08/2017, 01/27/2018   Influenza, Quadrivalent, Recombinant, Inj, Pf 03/27/2020   Influenza,inj,Quad PF,6+ Mos 02/27/2021   PFIZER(Purple Top)SARS-COV-2 Vaccination 08/15/2019, 09/01/2019, 03/20/2020   Pneumococcal Polysaccharide-23 03/21/2018   Tdap 06/01/2013   Zoster Recombinat (Shingrix) 12/10/2017, 06/22/2018   We updated and reviewed the patient's past history in detail and it is documented below. Allergies: Patient has No Known Allergies. Past Medical History  has a past medical history of Arthritis, Chicken pox, Degenerative tear of left medial meniscus (12/10/2017), Diet-controlled diabetes mellitus ([place]) (12/10/2017), High triglycerides, Hyperlipidemia, Left lumbar radiculopathy (09/16/2017), Lumbar herniated disc (09/16/2017), and Microscopic hematuria (05/09/2021). Past Surgical History Patient  has a past surgical history that  includes Tonsilectomy, adenoidectomy, bilateral myringotomy and tubes and Tracheostomy. Social History Patient  reports that he has quit smoking. He has never used smokeless tobacco. He  reports that he does not currently use alcohol. He reports that he does not use drugs. Family History family history includes Arthritis in his father and maternal grandmother; Asthma in his mother; Cancer in his maternal grandfather, maternal grandmother, paternal grandfather, and paternal grandmother; Depression in his sister; Diabetes in his mother; Heart attack in his father; Heart disease in his father and paternal grandfather; Hyperlipidemia in his brother, father, and mother; Kidney disease in his mother; Mental illness in his sister; Miscarriages / Stillbirths in his mother; Stroke in his mother. Review of Systems: Constitutional: negative for fever or malaise Ophthalmic: negative for photophobia, double vision or loss of vision Cardiovascular: negative for chest pain, dyspnea on exertion, or new LE swelling Respiratory: negative for SOB or persistent cough Gastrointestinal: negative for abdominal pain, change in bowel habits or melena Genitourinary: negative for dysuria or gross hematuria Musculoskeletal: negative for new gait disturbance or muscular weakness Integumentary: negative for new or persistent rashes Neurological: negative for TIA or stroke symptoms Psychiatric: negative for SI or delusions Allergic/Immunologic: negative for hives  Patient Care Team    Relationship Specialty Notifications Start End  Leamon Arnt, MD PCP - General Family Medicine  09/16/17   Melida Quitter, MD Consulting Physician Otolaryngology  09/16/17   Gaynelle Arabian, MD Consulting Physician Orthopedic Surgery  09/16/17   Keene Breath., MD  Ophthalmology  09/16/17   Dr. Gennette Pac    09/16/17    Comment: Dentist  Lucas Mallow, MD Consulting Physician Urology  05/09/21    Objective  Vitals: BP 126/80   Pulse 79   Temp 97.7 F (36.5 C) (Temporal)   Ht 5\' 8"  (1.727 m)   Wt 213 lb (96.6 kg)   SpO2 96%   BMI 32.39 kg/m  General:  Well developed, well nourished, no acute distress  Psych:  Alert  and orientedx3,normal mood and affect HEENT:  Normocephalic, atraumatic, non-icteric sclera, PERRL, oropharynx is clear without mass or exudate, supple neck without adenopathy, mass or thyromegaly Cardiovascular:  Normal S1, S2, RRR without gallop, rub or murmur, nondisplaced PMI, +2 distal pulses in bilateral upper and lower extremities. Respiratory:  Good breath sounds bilaterally, CTAB with normal respiratory effort Gastrointestinal: normal bowel sounds, soft, non-tender, no noted masses. No HSM MSK: no deformities, contusions. Joints are without erythema or swelling. Spine and CVA region are nontender Skin:  Warm, no rashes or suspicious lesions noted Neurologic:    Mental status is normal. CN 2-11 are normal. Gross motor and sensory exams are normal. Stable gait. No tremor GU: No inguinal hernias or adenopathy are appreciated bilaterally Diabetic Foot Exam: Appearance - no lesions, ulcers or calluses Skin - no sigificant pallor or erythema Monofilament testing - sensitive bilaterally in following locations:  Right - Great toe, medial, central, lateral ball and posterior foot intact  Left - Great toe, medial, central, lateral ball and posterior foot intact Pulses - +2 distally bilaterally    Assessment  1. Annual physical exam   2. Microscopic hematuria   3. Controlled type 2 diabetes mellitus without complication, without long-term current use of insulin ([place])   4. Mixed hyperlipidemia   5. Class 1 obesity due to excess calories with serious comorbidity and body mass index (BMI) of 32.0 to 32.9 in adult      Plan  Male Wellness Visit: Age appropriate  Health Maintenance and Prevention measures were discussed with patient. Included topics are cancer screening recommendations, ways to keep healthy (see AVS) including dietary and exercise recommendations, regular eye and dental care, use of seat belts, and avoidance of moderate alcohol use and tobacco use.  BMI: discussed patient's BMI  and encouraged positive lifestyle modifications to help get to or maintain a target BMI. HM needs and immunizations were addressed and ordered. See below for orders. See HM and immunization section for updates.  Prevnar 20 given; recommend fourth COVID booster given multiple risk factors for high risk disease. Routine labs and screening tests ordered including cmp, cbc and lipids where appropriate. Discussed recommendations regarding Vit D and calcium supplementation (see AVS)  Chronic disease f/u and/or acute problem visit: (deemed necessary to be done in addition to the wellness visit): Diabetes: Check A1c.  Goal should be improved on Farxiga and metformin.  Continue diabetic diet.  Other diabetic parameters are up-to-date Obesity: Continues weight loss Hyperlipidemia on statin, Crestor 20 mg nightly.  Recheck LDL.  Goal less than 70.  Check LFTs Microscopic hematuria: Undergoing evaluation per urology  Follow up: 3 months follow-up diabetes Commons side effects, risks, benefits, and alternatives for medications and treatment plan prescribed today were discussed, and the patient expressed understanding of the given instructions. Patient is instructed to call or message via MyChart if he/she has any questions or concerns regarding our treatment plan. No barriers to understanding were identified. We discussed Red Flag symptoms and signs in detail. Patient expressed understanding regarding what to do in case of urgent or emergency type symptoms.  Medication list was reconciled, printed and provided to the patient in AVS. Patient instructions and summary information was reviewed with the patient as documented in the AVS. This note was prepared with assistance of Dragon voice recognition software. Occasional wrong-word or sound-a-like substitutions may have occurred due to the inherent limitations of voice recognition software  This visit occurred during the SARS-CoV-2 public health emergency.  Safety  protocols were in place, including screening questions prior to the visit, additional usage of staff PPE, and extensive cleaning of exam room while observing appropriate contact time as indicated for disinfecting solutions.   Orders Placed This Encounter  Procedures   Hemoglobin A1c   CBC with Differential/Platelet   Comprehensive metabolic panel   Lipid panel   TSH   Meds ordered this encounter  Medications   rosuvastatin (CRESTOR) 20 MG tablet    Sig: Take 1 tablet (20 mg total) by mouth daily.    Dispense:  90 tablet    Refill:  3   empagliflozin (JARDIANCE) 10 MG TABS tablet    Sig: Take 1 tablet (10 mg total) by mouth daily before breakfast.    Dispense:  90 tablet    Refill:  3

## 2021-05-09 NOTE — Patient Instructions (Signed)
Please return in 3 months for diabetes follow up   I will release your lab results to you on your MyChart account with further instructions. Please reply with any questions.    Today you were given your Prevnar 20 vaccination.  Your pneumococcal vaccinations are now complete.  If you have any questions or concerns, please don't hesitate to send me a message via MyChart or call the office at [phone]. Thank you for visiting with [place] today! It's our pleasure caring for you.

## 2021-06-06 HISTORY — PX: KIDNEY STONE SURGERY: SHX686

## 2021-06-23 ENCOUNTER — Encounter: Payer: Self-pay | Admitting: Family Medicine

## 2021-07-11 ENCOUNTER — Encounter: Payer: Self-pay | Admitting: Family Medicine

## 2021-07-11 DIAGNOSIS — N210 Calculus in bladder: Secondary | ICD-10-CM | POA: Insufficient documentation

## 2021-08-04 ENCOUNTER — Ambulatory Visit: Payer: Managed Care, Other (non HMO) | Admitting: Family Medicine

## 2021-08-04 ENCOUNTER — Encounter: Payer: Self-pay | Admitting: Family Medicine

## 2021-08-04 ENCOUNTER — Other Ambulatory Visit: Payer: Self-pay

## 2021-08-04 VITALS — BP 126/78 | HR 67 | Temp 97.9°F | Ht 68.0 in | Wt 206.6 lb

## 2021-08-04 DIAGNOSIS — E662 Morbid (severe) obesity with alveolar hypoventilation: Secondary | ICD-10-CM

## 2021-08-04 DIAGNOSIS — E782 Mixed hyperlipidemia: Secondary | ICD-10-CM | POA: Diagnosis not present

## 2021-08-04 DIAGNOSIS — E1169 Type 2 diabetes mellitus with other specified complication: Secondary | ICD-10-CM

## 2021-08-04 DIAGNOSIS — N201 Calculus of ureter: Secondary | ICD-10-CM

## 2021-08-04 DIAGNOSIS — Z6834 Body mass index (BMI) 34.0-34.9, adult: Secondary | ICD-10-CM

## 2021-08-04 LAB — POCT GLYCOSYLATED HEMOGLOBIN (HGB A1C): HbA1c, POC (prediabetic range): 6.1 % (ref 5.7–6.4)

## 2021-08-04 NOTE — Patient Instructions (Addendum)
Please return in 6 months to recheck diabetes. ? ?Check to see if [facility] [place]ade is covered: if so, you can take this in place of the jardiance. Let me know.  ? ?Lab Results  ?Component Value Date  ? HGBA1C 6.1 08/04/2021  ? HGBA1C 6.3 05/09/2021  ? HGBA1C 7.1 (A) 03/03/2021  ?  ? ?If you have any questions or concerns, please don't hesitate to send me a message via MyChart or call the office at [phone]. Thank you for visiting with [place] today! It's our pleasure caring for you.  ?

## 2021-08-04 NOTE — Progress Notes (Signed)
? ?Subjective  ?CC:  ?Chief Complaint  ?Patient presents with  ? Diabetes  ?  3 month fu- Pt is fasting. No concerns today.  ? ? ?HPI: Karl [name] is a 66 y.o. male who presents to the office today for follow up of diabetes and problems listed above in the chief complaint.  ?Diabetes follow up: His diabetic control is reported as Improved.  Continues on Jardiance.  10 and 10 mg daily and metformin 1000 twice daily.  Diet continues to be improved.  Weight is down.  Overall feels great.  Has questions about his medications.He denies exertional CP or SOB or symptomatic hypoglycemia. He denies foot sores or paresthesias.  ?Obesity: BMI is trending downwards.  Continue with diet changes. ?Reviewed records from urology.  Work-up for microscopic hematuria revealed 2 large right ureteral calculi with right hydronephrosis.  He has since had calculi removed, stents placed and removed.  Normal follow-up since. ? ?Wt Readings from Last 3 Encounters:  ?08/04/21 206 lb 9.6 oz (93.7 kg)  ?05/09/21 213 lb (96.6 kg)  ?03/03/21 216 lb 12.8 oz (98.3 kg)  ? ? ?BP Readings from Last 3 Encounters:  ?08/04/21 126/78  ?05/09/21 126/80  ?03/03/21 (!) 148/90  ? ? ?Assessment  ?1. Combined hyperlipidemia associated with type 2 diabetes mellitus (HCC)   ?2. Class 1 obesity with alveolar hypoventilation, serious comorbidity, and body mass index (BMI) of 34.0 to 34.9 in adult [facility]) Chronic  ?3. Ureteral calculi   ? ?  ?Plan  ?Diabetes is currently very well controlled.  Continue metformin 1000 twice daily and Jardiance 10 daily.  Consider change to [facility] for renal protection if covered.  Education given.  Recheck 6 months ?Obesity: Improving.  Continue weight loss.  Continue diet and exercise ?Ureteral calculi: Status post removal.  Stable. ? ?Follow up: 6 months for recheck. ?Orders Placed This Encounter  ?Procedures  ? POCT HgB A1C  ? ?No orders of the defined types were placed in this encounter. ? ?  ? ?Immunization History   ?Administered Date(s) Administered  ? Influenza Inj Mdck Quad Pf 03/08/2017, 01/27/2018  ? Influenza, Quadrivalent, Recombinant, Inj, Pf 03/27/2020  ? Influenza,inj,Quad PF,6+ Mos 02/27/2021  ? Moderna Covid-19 Vaccine Bivalent Booster 52yrs & up 05/20/2021  ? PFIZER(Purple Top)SARS-COV-2 Vaccination 08/15/2019, 09/01/2019, 03/20/2020  ? PNEUMOCOCCAL CONJUGATE-20 05/09/2021  ? Pneumococcal Polysaccharide-23 03/21/2018  ? Tdap 06/01/2013  ? Zoster Recombinat (Shingrix) 12/10/2017, 06/22/2018  ? ? ?Diabetes Related Lab Review: ?Lab Results  ?Component Value Date  ? HGBA1C 6.1 08/04/2021  ? HGBA1C 6.3 05/09/2021  ? HGBA1C 7.1 (A) 03/03/2021  ?  ?Lab Results  ?Component Value Date  ? MICROALBUR 1.7 03/03/2021  ? ?Lab Results  ?Component Value Date  ? CREATININE 1.16 05/09/2021  ? BUN 23 05/09/2021  ? NA 138 05/09/2021  ? K 4.2 05/09/2021  ? CL 101 05/09/2021  ? CO2 28 05/09/2021  ? ?Lab Results  ?Component Value Date  ? CHOL 128 05/09/2021  ? CHOL 133 10/23/2020  ? CHOL 157 04/23/2020  ? ?Lab Results  ?Component Value Date  ? HDL 48.60 05/09/2021  ? HDL 41.50 10/23/2020  ? HDL 44 04/23/2020  ? ?Lab Results  ?Component Value Date  ? LDLCALC 43 05/09/2021  ? LDLCALC 60 10/23/2020  ? LDLCALC 85 04/23/2020  ? ?Lab Results  ?Component Value Date  ? TRIG 182.0 (H) 05/09/2021  ? TRIG 157.0 (H) 10/23/2020  ? TRIG 181 (H) 04/23/2020  ? ?Lab Results  ?Component Value Date  ?  CHOLHDL 3 05/09/2021  ? CHOLHDL 3 10/23/2020  ? CHOLHDL 3.6 04/23/2020  ? ?No results found for: LDLDIRECT ?The ASCVD Risk score (Arnett DK, et al., 2019) failed to calculate for the following reasons: ?  The valid total cholesterol range is 130 to 320 mg/dL ?I have reviewed the PMH, Fam and Soc history. ?Patient Active Problem List  ? Diagnosis Date Noted  ? Combined hyperlipidemia associated with type 2 diabetes mellitus (HCC) 08/04/2021  ?  Priority: High  ? Ureteral calculi 08/04/2021  ?  Priority: High  ?  Work-up for microscopic hematuria: Ureteral  calculi with right hydronephrosis: Status post stone removal and stent placement.  Normal follow-up 07/2021.  Dr. Alvester Morin, urology ?  ? Controlled type 2 diabetes mellitus without complication, without long-term current use of insulin (HCC) 12/10/2017  ?  Priority: High  ? Obesity (BMI 30.0-34.9) 09/16/2017  ?  Priority: High  ? Lumbar herniated disc 09/16/2017  ?  Priority: Medium   ?  L3,4,5 by MRI, NeuroSurgery ?  ? Left lumbar radiculopathy 09/16/2017  ?  Priority: Medium   ?  Evaluated by NS and treated with PT, [place].  ?  ? Osteoarthritis, hand 09/16/2017  ?  Priority: Medium   ? Recurrent sinusitis 12/16/2015  ?  Priority: Medium   ? Degenerative tear of left medial meniscus 12/10/2017  ?  Priority: Low  ?  Dr. Despina Hick ?  ? Heat rash 09/16/2017  ?  Priority: Low  ?  Recurring;  ?  ? Non-seasonal allergic rhinitis 12/16/2015  ?  Priority: Low  ? ? ?Social History: ?Patient  reports that he has quit smoking. He has never used smokeless tobacco. He reports that he does not currently use alcohol. He reports that he does not use drugs. ? ?Review of Systems: ?Ophthalmic: negative for eye pain, loss of vision or double vision ?Cardiovascular: negative for chest pain ?Respiratory: negative for SOB or persistent cough ?Gastrointestinal: negative for abdominal pain ?Genitourinary: negative for dysuria or gross hematuria ?MSK: negative for foot lesions ?Neurologic: negative for weakness or gait disturbance ? ?Objective  ?Vitals: BP 126/78   Pulse 67   Temp 97.9 ?F (36.6 ?C) (Temporal)   Ht 5\' 8"  (1.727 m)   Wt 206 lb 9.6 oz (93.7 kg)   SpO2 98%   BMI 31.41 kg/m?  ?General: well appearing, no acute distress  ?Psych:  Alert and oriented, normal mood and affect ?HEENT:  Normocephalic, atraumatic, moist mucous membranes, supple neck  ?Cardiovascular:  Nl S1 and S2, RRR without murmur, gallop or rub. no edema ?Respiratory:  Good breath sounds bilaterally, CTAB with normal effort, no rales ? ? ? ?Diabetic education:  ongoing education regarding chronic disease management for diabetes was given today. We continue to reinforce the ABC's of diabetic management: A1c (<7 or 8 dependent upon patient), tight blood pressure control, and cholesterol management with goal LDL < 100 minimally. We discuss diet strategies, exercise recommendations, medication options and possible side effects. At each visit, we review recommended immunizations and preventive care recommendations for diabetics and stress that good diabetic control can prevent other problems. See below for this patient's data. ? ? ?Commons side effects, risks, benefits, and alternatives for medications and treatment plan prescribed today were discussed, and the patient expressed understanding of the given instructions. Patient is instructed to call or message via MyChart if he/she has any questions or concerns regarding our treatment plan. No barriers to understanding were identified. We discussed Red Flag symptoms and signs  in detail. Patient expressed understanding regarding what to do in case of urgent or emergency type symptoms.  ?Medication list was reconciled, printed and provided to the patient in AVS. Patient instructions and summary information was reviewed with the patient as documented in the AVS. ?This note was prepared with assistance of Systems analyst. Occasional wrong-word or sound-a-like substitutions may have occurred due to the inherent limitations of voice recognition software ? ?This visit occurred during the SARS-CoV-2 public health emergency.  Safety protocols were in place, including screening questions prior to the visit, additional usage of staff PPE, and extensive cleaning of exam room while observing appropriate contact time as indicated for disinfecting solutions.  ? ?

## 2021-08-12 ENCOUNTER — Other Ambulatory Visit: Payer: Self-pay | Admitting: Family Medicine

## 2021-10-20 LAB — HM DIABETES EYE EXAM

## 2022-01-29 ENCOUNTER — Other Ambulatory Visit: Payer: Self-pay | Admitting: Family Medicine

## 2022-02-12 ENCOUNTER — Ambulatory Visit: Payer: Managed Care, Other (non HMO) | Admitting: Family Medicine

## 2022-02-12 ENCOUNTER — Encounter: Payer: Self-pay | Admitting: Family Medicine

## 2022-02-12 VITALS — BP 126/84 | HR 84 | Temp 98.4°F | Ht 68.0 in | Wt 203.4 lb

## 2022-02-12 DIAGNOSIS — E119 Type 2 diabetes mellitus without complications: Secondary | ICD-10-CM | POA: Diagnosis not present

## 2022-02-12 DIAGNOSIS — E1169 Type 2 diabetes mellitus with other specified complication: Secondary | ICD-10-CM

## 2022-02-12 DIAGNOSIS — E782 Mixed hyperlipidemia: Secondary | ICD-10-CM

## 2022-02-12 DIAGNOSIS — E66811 Obesity, class 1: Secondary | ICD-10-CM

## 2022-02-12 DIAGNOSIS — Z23 Encounter for immunization: Secondary | ICD-10-CM | POA: Diagnosis not present

## 2022-02-12 DIAGNOSIS — E669 Obesity, unspecified: Secondary | ICD-10-CM

## 2022-02-12 LAB — COMPREHENSIVE METABOLIC PANEL
ALT: 26 U/L (ref 0–53)
AST: 27 U/L (ref 0–37)
Albumin: 4.2 g/dL (ref 3.5–5.2)
Alkaline Phosphatase: 35 U/L — ABNORMAL LOW (ref 39–117)
BUN: 18 mg/dL (ref 6–23)
CO2: 30 mEq/L (ref 19–32)
Calcium: 9.6 mg/dL (ref 8.4–10.5)
Chloride: 101 mEq/L (ref 96–112)
Creatinine, Ser: 1.05 mg/dL (ref 0.40–1.50)
GFR: 74.63 mL/min (ref >60.00)
Glucose, Bld: 113 mg/dL — ABNORMAL HIGH (ref 70–99)
Potassium: 4 mEq/L (ref 3.5–5.1)
Sodium: 139 mEq/L (ref 135–145)
Total Bilirubin: 0.6 mg/dL (ref 0.2–1.2)
Total Protein: 7.1 g/dL (ref 6.0–8.3)

## 2022-02-12 LAB — LIPID PANEL
Cholesterol: 118 mg/dL (ref 0–200)
HDL: 44 mg/dL (ref >39.00)
LDL Cholesterol: 47 mg/dL (ref 0–99)
NonHDL: 74.43
Total CHOL/HDL Ratio: 3
Triglycerides: 138 mg/dL (ref 0.0–149.0)
VLDL: 27.6 mg/dL (ref 0.0–40.0)

## 2022-02-12 LAB — CBC WITH DIFFERENTIAL/PLATELET
Basophils Absolute: 0.1 10*3/uL (ref 0.0–0.1)
Basophils Relative: 1 % (ref 0.0–3.0)
Eosinophils Absolute: 0.2 10*3/uL (ref 0.0–0.7)
Eosinophils Relative: 3.1 % (ref 0.0–5.0)
HCT: 48.2 % (ref 39.0–52.0)
Hemoglobin: 16.3 g/dL (ref 13.0–17.0)
Lymphocytes Relative: 24.5 % (ref 12.0–46.0)
Lymphs Abs: 1.9 10*3/uL (ref 0.7–4.0)
MCHC: 33.8 g/dL (ref 30.0–36.0)
MCV: 88.9 fl (ref 78.0–100.0)
Monocytes Absolute: 0.5 10*3/uL (ref 0.1–1.0)
Monocytes Relative: 7.1 % (ref 3.0–12.0)
Neutro Abs: 4.9 10*3/uL (ref 1.4–7.7)
Neutrophils Relative %: 64.3 % (ref 43.0–77.0)
Platelets: 282 10*3/uL (ref 150.0–400.0)
RBC: 5.43 Mil/uL (ref 4.22–5.81)
RDW: 13.7 % (ref 11.5–15.5)
WBC: 7.6 10*3/uL (ref 4.0–10.5)

## 2022-02-12 LAB — MICROALBUMIN / CREATININE URINE RATIO
Creatinine,U: 88.8 mg/dL
Microalb Creat Ratio: 0.8 mg/g (ref 0.0–30.0)
Microalb, Ur: <0.7 mg/dL (ref 0.0–1.9)

## 2022-02-12 LAB — POCT GLYCOSYLATED HEMOGLOBIN (HGB A1C): Hemoglobin A1C: 6.4 % — AB (ref 4.0–5.6)

## 2022-02-12 NOTE — Progress Notes (Signed)
Subjective  CC:  Chief Complaint  Patient presents with   Diabetes    Pt here for 12mo F/U with DM    HPI: Karl SHIis a 66y.o. male who presents to the office today for follow up of diabetes and problems listed above in the chief complaint.  Diabetes follow up: His diabetic control is reported as Unchanged. Eating well and exercising.  He denies exertional CP or SOB or symptomatic hypoglycemia. He denies foot sores or paresthesias. Eye exam current. Flu shot today.  bp: home reading.s 120s/180-184. Feeling well. Taking medications w/o adverse effects. No symptoms of CHF, angina; no palpitations, sob, cp or lower extremity edema. Compliant with meds.  HLD on statin and fasting for recheck w/o aes Obesity: continues to trend downward. His goal weight is 180.  HM: current.   Wt Readings from Last 3 Encounters:  02/12/22 203 lb 6.4 oz (92.3 kg)  08/04/21 206 lb 9.6 oz (93.7 kg)  05/09/21 213 lb (96.6 kg)    BP Readings from Last 3 Encounters:  02/12/22 126/84  08/04/21 126/78  05/09/21 126/80    Assessment  1. Controlled type 2 diabetes mellitus without complication, without long-term current use of insulin ([place] [place]   2. Combined hyperlipidemia associated with type 2 diabetes mellitus (HCC)   3. Obesity (BMI 30.0-34.9)   4. Need for immunization against influenza      Plan  Diabetes is currently very well controlled. On jardiance 10 and met 1000 bid; flu shot and check labs today.  HLD on crestor 20 and fenofibrate; recheck fasting levels today bp: fairly well controlled. Would like diastolic a little lower. Continue weight loss and hom.e monitoring. Discussed goals. Start ACE if + microalbuminuria   Follow up: 6 mo for recheck. Orders Placed This Encounter  Procedures   Flu Vaccine QUAD High Dose(Fluad)   CBC with Differential/Platelet   Comprehensive metabolic panel   Lipid panel   Microalbumin / creatinine urine ratio   POCT HgB A1C   No orders of the  defined types were placed in this encounter.     Immunization History  Administered Date(s) Administered   Fluad Quad(high Dose 65+) 02/12/2022   Influenza Inj Mdck Quad Pf 03/08/2017, 01/27/2018   Influenza, Quadrivalent, Recombinant, Inj, Pf 03/27/2020   Influenza,inj,Quad PF,6+ Mos 02/27/2021   Moderna Covid-19 Vaccine Bivalent Booster 125yr& up 05/20/2021   PFIZER(Purple Top)SARS-COV-2 Vaccination 08/15/2019, 09/01/2019, 03/20/2020   PNEUMOCOCCAL CONJUGATE-20 05/09/2021   Pneumococcal Polysaccharide-23 03/21/2018   Tdap 06/01/2013   Zoster Recombinat (Shingrix) 12/10/2017, 06/22/2018    Diabetes Related Lab Review: Lab Results  Component Value Date   HGBA1C 6.4 (A) 02/12/2022   HGBA1C 6.1 08/04/2021   HGBA1C 6.3 05/09/2021    Lab Results  Component Value Date   MICROALBUR 1.7 03/03/2021   Lab Results  Component Value Date   CREATININE 1.16 05/09/2021   BUN 23 05/09/2021   NA 138 05/09/2021   K 4.2 05/09/2021   CL 101 05/09/2021   CO2 28 05/09/2021   Lab Results  Component Value Date   CHOL 128 05/09/2021   CHOL 133 10/23/2020   CHOL 157 04/23/2020   Lab Results  Component Value Date   HDL 48.60 05/09/2021   HDL 41.50 10/23/2020   HDL 44 04/23/2020   Lab Results  Component Value Date   LDLCALC 43 05/09/2021   LDLCALC 60 10/23/2020   LDAugusta5 04/23/2020   Lab Results  Component Value Date   TRIG 182.0 (  H) 05/09/2021   TRIG 157.0 (H) 10/23/2020   TRIG 181 (H) 04/23/2020   Lab Results  Component Value Date   CHOLHDL 3 05/09/2021   CHOLHDL 3 10/23/2020   CHOLHDL 3.6 04/23/2020   No results found for: "LDLDIRECT" The ASCVD Risk score (Arnett DK, et al., 2019) failed to calculate for the following reasons:   The valid total cholesterol range is 130 to 320 mg/dL I have reviewed the PMH, Fam and Soc history. Patient Active Problem List   Diagnosis Date Noted   Combined hyperlipidemia associated with type 2 diabetes mellitus ([place]) 08/04/2021     Priority: High   Ureteral calculi 08/04/2021    Priority: High    Work-up for microscopic hematuria: Ureteral calculi with right hydronephrosis: Status post stone removal and stent placement.  Normal follow-up 07/2021.  Dr. Gloriann Loan, urology    Controlled type 2 diabetes mellitus without complication, without long-term current use of insulin ([place]) 12/10/2017    Priority: High   Obesity (BMI 30.0-34.9) 09/16/2017    Priority: High   Lumbar herniated disc 09/16/2017    Priority: Medium     L3,4,5 by MRI, NeuroSurgery    Left lumbar radiculopathy 09/16/2017    Priority: Medium     Evaluated by NS and treated with PT, [facility].     Osteoarthritis, hand 09/16/2017    Priority: Medium    Recurrent sinusitis 12/16/2015    Priority: Medium    Degenerative tear of left medial meniscus 12/10/2017    Priority: Low    Dr. Maureen Ralphs    Heat rash 09/16/2017    Priority: Low    Recurring;     Non-seasonal allergic rhinitis 12/16/2015    Priority: Low    Social History: Patient  reports that he has quit smoking. He has never used smokeless tobacco. He reports that he does not currently use alcohol. He reports that he does not use drugs.  Review of Systems: Ophthalmic: negative for eye pain, loss of vision or double vision Cardiovascular: negative for chest pain Respiratory: negative for SOB or persistent cough Gastrointestinal: negative for abdominal pain Genitourinary: negative for dysuria or gross hematuria MSK: negative for foot lesions Neurologic: negative for weakness or gait disturbance  Objective  Vitals: BP 126/84   Pulse 84   Temp 98.4 F (36.9 C)   Ht 5' 8"  (1.727 m)   Wt 203 lb 6.4 oz (92.3 kg)   SpO2 98%   BMI 30.93 kg/m  General: well appearing, no acute distress  Psych:  Alert and oriented, normal mood and affect HEENT:  Normocephalic, atraumatic, moist mucous membranes, supple neck  Cardiovascular:  Nl S1 and S2, RRR without murmur, gallop or rub. no  edema Respiratory:  Good breath sounds bilaterally, CTAB with normal effort, no rales Gastrointestinal: normal BS, soft, nontender Foot exam: nl proprioception and sensation , +2 distal pulses bilaterally    Diabetic education: ongoing education regarding chronic disease management for diabetes was given today. We continue to reinforce the ABC's of diabetic management: A1c (<7 or 8 dependent upon patient), tight blood pressure control, and cholesterol management with goal LDL < 100 minimally. We discuss diet strategies, exercise recommendations, medication options and possible side effects. At each visit, we review recommended immunizations and preventive care recommendations for diabetics and stress that good diabetic control can prevent other problems. See below for this patient's data.   Commons side effects, risks, benefits, and alternatives for medications and treatment plan prescribed today were discussed, and the patient  expressed understanding of the given instructions. Patient is instructed to call or message via MyChart if he/she has any questions or concerns regarding our treatment plan. No barriers to understanding were identified. We discussed Red Flag symptoms and signs in detail. Patient expressed understanding regarding what to do in case of urgent or emergency type symptoms.  Medication list was reconciled, printed and provided to the patient in AVS. Patient instructions and summary information was reviewed with the patient as documented in the AVS. This note was prepared with assistance of Dragon voice recognition software. Occasional wrong-word or sound-a-like substitutions may have occurred due to the inherent limitations of voice recognition software

## 2022-02-12 NOTE — Patient Instructions (Signed)
Please return in 6 months for diabetes and recheck.   I will release your lab results to you on your MyChart account with further instructions. You may see the results before I do, but when I review them I will send you a message with my report or have my assistant call you if things need to be discussed. Please reply to my message with any questions. Thank you!   If you have any questions or concerns, please don't hesitate to send me a message via MyChart or call the office at [phone]. Thank you for visiting with [place] today! It's our pleasure caring for you.

## 2022-02-13 ENCOUNTER — Telehealth: Payer: Self-pay | Admitting: Family Medicine

## 2022-02-13 NOTE — Telephone Encounter (Signed)
  LAST APPOINTMENT DATE:  02/12/22  NEXT APPOINTMENT DATE: 08/12/22  MEDICATION:augmented betamethasone dipropionate (DIPROLENE-AF) 0.05 % cream  Is the patient out of medication? Yes  PHARMACY: [facility] [id number] - [place] - [address]  [address], [place] [address]  Phone:  [phone]  Fax:  [phone]

## 2022-02-16 MED ORDER — BETAMETHASONE DIPROPIONATE AUG 0.05 % EX CREA: TOPICAL_CREAM | CUTANEOUS | 2 refills | Status: AC

## 2022-04-20 ENCOUNTER — Encounter: Payer: Self-pay | Admitting: Internal Medicine

## 2022-04-20 ENCOUNTER — Ambulatory Visit: Payer: Managed Care, Other (non HMO) | Admitting: Internal Medicine

## 2022-04-20 ENCOUNTER — Telehealth: Payer: Self-pay | Admitting: Family Medicine

## 2022-04-20 VITALS — BP 149/72 | HR 89 | Temp 95.8°F | Resp 12 | Ht 68.0 in | Wt 211.6 lb

## 2022-04-20 DIAGNOSIS — J328 Other chronic sinusitis: Secondary | ICD-10-CM

## 2022-04-20 MED ORDER — AMOXICILLIN-POT CLAVULANATE 875-125 MG PO TABS: 1.0000 | ORAL_TABLET | Freq: Two times a day (BID) | ORAL | 0 refills | Status: DC

## 2022-04-20 MED ORDER — PREDNISONE 20 MG PO TABS: ORAL_TABLET | ORAL | 0 refills | Status: DC

## 2022-04-20 NOTE — Progress Notes (Signed)
Adult nurse [facility] at [facility]:  [phone]   Routine Medical Office Visit  Patient:  Karl [name]      Age: 66 y.o.       Sex:  male  Date:   04/21/2022  PCP:    Willow Ora, MD    Today's Healthcare Provider: Lula Olszewski, MD  Assessment/Plan:   Esias was seen today for possible sinus infection, right ear blocked, cough and covid test negative.  Other chronic sinusitis -     Amoxicillin-Pot Clavulanate; Take 1 tablet by mouth 2 (two) times daily.  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  I think looks allergic by exam but he reports purulent drainage and about 7 days duration so I gave antibiotic in case benadryl and prednisone don't improve the situation.   Certainly it could be viral with negative COVID -in which case only the decongestants will be helpful we discussed sinus rinses and sudafed.  Advise follow up with Dr. Mardelle Matte in 1-2 weeks to discuss response.  Gave extensive handouts about eustachian tube function and management.  He has been to ENT in past for similar - and also has history of allergy shots.    Subjective:   Karl [name] is a 66 y.o. male with past medical history including: Past Medical History:  Diagnosis Date   Arthritis    Chicken pox    Degenerative tear of left medial meniscus 12/10/2017   Dr. Despina Hick   Diet-controlled diabetes mellitus (HCC) 12/10/2017   a1c 6.05 Jun 2016 and July 2019   High triglycerides    Hyperlipidemia    Left lumbar radiculopathy 09/16/2017   Evaluated by NS and treated with PT, [facility].    Lumbar herniated disc 09/16/2017   L3,4,5 by MRI, NeuroSurgery   Microscopic hematuria 05/09/2021   Neurology evaluation, Dr. Alvester Morin, December 2022   Personal history of kidney stones       He is presenting today with: Chief Complaint  Patient presents with   Possible sinus infection    No fever, sinus pressure or headache with a constant post nasal drip, started about seven days ago. Used nasal rinse, took Mucinex and medication called clear lungs (twice a day). Clear lungs seems to help with sinus infections. Mucus is a cream color when able to blow nose.   Right ear blocked   Cough    Very minimal   COVID test negative    Taken today.    Continuous nasal drainage Using generic flonase Had 6-7 days of chronic drainage soometimes white creamy No systemic symptoms. Clear lungs contains a bunch of chinese roots, so active ingredients are unknown.         Objective:  Physical Exam: BP (!) 149/72 (BP Location: Left Arm, Patient Position: Sitting)   Pulse 89   Temp (!) 95.8 F (35.4 C) (Temporal)   Resp 12   Ht 5\' 8"  (1.727 m)   Wt 211 lb 9.6 oz (96 kg)   SpO2 99%   BMI 32.17 kg/m   Problem-specific physical exam findings:  Physical Exam Vitals and nursing note reviewed.  Constitutional:      General: He is not in acute distress.    Appearance: Normal appearance. He is not ill-appearing,  toxic-appearing or diaphoretic.  HENT:     Head: Normocephalic.     Nose: Mucosal edema and rhinorrhea present. No nasal deformity, septal deviation, signs of injury, laceration, nasal tenderness or congestion.     Right Nostril: No foreign body, epistaxis, septal hematoma or occlusion.     Left Nostril: No foreign body, epistaxis, septal hematoma or occlusion.     Right Turbinates: Not enlarged, swollen or pale.     Left Turbinates: Not enlarged, swollen or pale.     Right Sinus: No maxillary sinus tenderness or frontal sinus tenderness.     Left Sinus: No maxillary sinus tenderness or frontal sinus tenderness.  Eyes:     General: No scleral icterus.    Conjunctiva/sclera: Conjunctivae normal.  Skin:    General: Skin is warm and dry.  Neurological:     General:  No focal deficit present.     Mental Status: He is alert.  Psychiatric:        Mood and Affect: Mood normal.        Behavior: Behavior normal.       Results Reviewed:  No results found for any visits on 04/20/22.   Recent Results (from the past 2160 hour(s))  POCT HgB A1C     Status: Abnormal   Collection Time: 02/12/22  9:09 AM  Result Value Ref Range   Hemoglobin A1C 6.4 (A) 4.0 - 5.6 %   HbA1c POC (<> result, manual entry)     HbA1c, POC (prediabetic range)     HbA1c, POC (controlled diabetic range)    CBC with Differential/Platelet     Status: None   Collection Time: 02/12/22  9:45 AM  Result Value Ref Range   WBC 7.6 4.0 - 10.5 K/uL   RBC 5.43 4.22 - 5.81 Mil/uL   Hemoglobin 16.3 13.0 - 17.0 g/dL   HCT 09.2 33.0 - 07.6 %   MCV 88.9 78.0 - 100.0 fl   MCHC 33.8 30.0 - 36.0 g/dL   RDW 22.6 33.3 - 54.5 %   Platelets 282.0 150.0 - 400.0 K/uL   Neutrophils Relative % 64.3 43.0 - 77.0 %   Lymphocytes Relative 24.5 12.0 - 46.0 %   Monocytes Relative 7.1 3.0 - 12.0 %   Eosinophils Relative 3.1 0.0 - 5.0 %   Basophils Relative 1.0 0.0 - 3.0 %   Neutro Abs 4.9 1.4 - 7.7 K/uL   Lymphs Abs 1.9 0.7 - 4.0 K/uL   Monocytes Absolute 0.5 0.1 - 1.0 K/uL   Eosinophils Absolute 0.2 0.0 - 0.7 K/uL   Basophils Absolute 0.1 0.0 - 0.1 K/uL  Comprehensive metabolic panel     Status: Abnormal   Collection Time: 02/12/22  9:45 AM  Result Value Ref Range   Sodium 139 135 - 145 mEq/L   Potassium 4.0 3.5 - 5.1 mEq/L   Chloride 101 96 - 112 mEq/L   CO2 30 19 - 32 mEq/L   Glucose, Bld 113 (H) 70 - 99 mg/dL   BUN 18 6 - 23 mg/dL   Creatinine, Ser 6.25 0.40 - 1.50 mg/dL   Total Bilirubin 0.6 0.2 - 1.2 mg/dL   Alkaline Phosphatase 35 (L) 39 - 117 U/L   AST 27 0 - 37 U/L   ALT 26 0 - 53 U/L   Total Protein 7.1 6.0 - 8.3 g/dL   Albumin 4.2 3.5 - 5.2 g/dL   GFR 63.89 >37.34 mL/min    Comment: Calculated using the CKD-EPI Creatinine Equation (2021)  Calcium 9.6 8.4 - 10.5 mg/dL  Lipid  panel     Status: None   Collection Time: 02/12/22  9:45 AM  Result Value Ref Range   Cholesterol 118 0 - 200 mg/dL    Comment: ATP III Classification       Desirable:  < 200 mg/dL               Borderline High:  200 - 239 mg/dL          High:  > = 161240 mg/dL   Triglycerides 096.0138.0 0.0 - 149.0 mg/dL    Comment: Normal:  <454<150 mg/dLBorderline High:  150 - 199 mg/dL   HDL 09.8144.00 >19.14>39.00 mg/dL   VLDL 78.227.6 0.0 - 95.640.0 mg/dL   LDL Cholesterol 47 0 - 99 mg/dL   Total CHOL/HDL Ratio 3     Comment:                Men          Women1/2 Average Risk     3.4          3.3Average Risk          5.0          4.42X Average Risk          9.6          7.13X Average Risk          15.0          11.0                       NonHDL 74.43     Comment: NOTE:  Non-HDL goal should be 30 mg/dL higher than patient's LDL goal (i.e. LDL goal of < 70 mg/dL, would have non-HDL goal of < 100 mg/dL)  Microalbumin / creatinine urine ratio     Status: None   Collection Time: 02/12/22  9:45 AM  Result Value Ref Range   Microalb, Ur <0.7 0.0 - 1.9 mg/dL   Creatinine,U 21.388.8 mg/dL   Microalb Creat Ratio 0.8 0.0 - 30.0 mg/g    Results for orders placed or performed in visit on 02/12/22  CBC with Differential/Platelet  Result Value Ref Range   WBC 7.6 4.0 - 10.5 K/uL   RBC 5.43 4.22 - 5.81 Mil/uL   Hemoglobin 16.3 13.0 - 17.0 g/dL   HCT 08.648.2 57.839.0 - 46.952.0 %   MCV 88.9 78.0 - 100.0 fl   MCHC 33.8 30.0 - 36.0 g/dL   RDW 62.913.7 52.811.5 - 41.315.5 %   Platelets 282.0 150.0 - 400.0 K/uL   Neutrophils Relative % 64.3 43.0 - 77.0 %   Lymphocytes Relative 24.5 12.0 - 46.0 %   Monocytes Relative 7.1 3.0 - 12.0 %   Eosinophils Relative 3.1 0.0 - 5.0 %   Basophils Relative 1.0 0.0 - 3.0 %   Neutro Abs 4.9 1.4 - 7.7 K/uL   Lymphs Abs 1.9 0.7 - 4.0 K/uL   Monocytes Absolute 0.5 0.1 - 1.0 K/uL   Eosinophils Absolute 0.2 0.0 - 0.7 K/uL   Basophils Absolute 0.1 0.0 - 0.1 K/uL  Comprehensive metabolic panel  Result Value Ref Range   Sodium 139 135 -  145 mEq/L   Potassium 4.0 3.5 - 5.1 mEq/L   Chloride 101 96 - 112 mEq/L   CO2 30 19 - 32 mEq/L   Glucose, Bld 113 (H) 70 - 99 mg/dL   BUN 18 6 - 23 mg/dL  Creatinine, Ser 1.05 0.40 - 1.50 mg/dL   Total Bilirubin 0.6 0.2 - 1.2 mg/dL   Alkaline Phosphatase 35 (L) 39 - 117 U/L   AST 27 0 - 37 U/L   ALT 26 0 - 53 U/L   Total Protein 7.1 6.0 - 8.3 g/dL   Albumin 4.2 3.5 - 5.2 g/dL   GFR 22.97 >98.92 mL/min   Calcium 9.6 8.4 - 10.5 mg/dL  Lipid panel  Result Value Ref Range   Cholesterol 118 0 - 200 mg/dL   Triglycerides 119.4 0.0 - 149.0 mg/dL   HDL 17.40 >81.44 mg/dL   VLDL 81.8 0.0 - 56.3 mg/dL   LDL Cholesterol 47 0 - 99 mg/dL   Total CHOL/HDL Ratio 3    NonHDL 74.43   Microalbumin / creatinine urine ratio  Result Value Ref Range   Microalb, Ur <0.7 0.0 - 1.9 mg/dL   Creatinine,U 14.9 mg/dL   Microalb Creat Ratio 0.8 0.0 - 30.0 mg/g  POCT HgB A1C  Result Value Ref Range   Hemoglobin A1C 6.4 (A) 4.0 - 5.6 %   HbA1c POC (<> result, manual entry)     HbA1c, POC (prediabetic range)     HbA1c, POC (controlled diabetic range)

## 2022-04-20 NOTE — Telephone Encounter (Signed)
Pt states: -He is not feeling well -Would like nurse to send in medication.  Pt requests: -call back to discuss    Pt declined office visit with any providers.   PHARMACY: [facility] [id number] - [place], [place] - [address]  [address], [place] [address]  Phone:  [phone]  Fax:  [phone]

## 2022-04-20 NOTE — Patient Instructions (Addendum)
It was a pleasure seeing you today! I truly hope you feel like you received 5 star service and please let me know if there is anything I can improve.  Lula Olszewski, MD   Today the plan is... try benadryl to see if its all allergic.  Then maybe prednisone if it is, but augmentin antibiotic if it isn't. Continue sinus rinses.  Call back for ENT or allergy referrals if desired. Plan to see Dr. Mardelle Matte 1-2 weeks.   This shows  how the eustachian canal works to drain the inner ear and how it is connected to the nasopharynx.  Nasal sinus rinses with saline nasal mist sprays can rinse all of the pollen and allergens and other irritants and pathogens out of his sinuses and nasopharynx, reducing the plugging/swelling around the eustachian tube.  The sinus rinse clears away the mucus and allows nasal steroid sprays to help reach the eustachian tube and reduce swellling to open it up.    Basic Sinus Care: Mist each nostril nightly with sterile saline nasal mist, then spray each nostril immediately after with fluticasone nasal spray (Flonase) or other steroid or antihistamine nasal pray Next, if symptoms persist, add a daily allergy pill.  Benadryl is the strongest but will make you very drowsy so only take when you can sleep after.  Ear Pressure Relief Maneuvers: Step 1 If the congestion is mild, you can often use simple maneuvers to quickly alter the pressure in your middle ear, such as: Swallowing Yawning Chewing gum Sucking on hard candy Similar methods can be used on children. If traveling with an infant or toddler, try giving them a bottle, pacifier, or something to drink or suck on.  Warm compress: Applying a warm, moist cloth to the back of your ear can help reduce swelling and help drain congested passages. In some cases, these interventions will cause the ears will pop without trying. If they don't, give it 20 minutes and see if swallowing, yawning, chewing gum, or sucking on hard candy  helps.  Step 2 If these methods alone don't help, you can try other interventions like:  Decongestants: OTC drugs like Afrin (oxymetazoline) or Sudafed (pseudoephedrine) work by reducing the swelling of blood vessels in the nasal passages and Eustachian tubes.  Never use these medications for more than a few days at a time, especially afrin is dependency-forming  If this isn't working or you need more than a few days of afrin or sudafed... you should make an appointment.   Step 3 (just for mod severe ear pressure and pain) If these interventions don't help, there are three other advanced strategies you can try called the Valsalva maneuver, the Toynbee maneuver, and the Frenzel maneuver.  Advanced Strategy 1:  The Valsalva maneuver Inhale. Pinch your nose shut with your fingers. Keeping your lips tightly shut, blow out forcefully as if you are blowing up a balloon. To increase the pressure, try bearing down as if having a bowel movement.  Advanced Strategy 2:  The Toynbee maneuver The Toynbee maneuver may also be safer than the Valsalva maneuver if you've had a previous eardrum injury. The Valsalva method exerts much more pressure on the eardrum and can possibly cause a rupture if you blow too forcefully.  Keep your mouth tightly shut. Pinch your nose shut with your fingers. Swallow hard.  Advanced Strategy 3: The Frenzel maneuver Pinch your nose shut with your fingers Close your mouth and place the tip of your tongue behind your upper front  teeth. Push the back of your tongue to the roof of your mouth as if making a hard "G" or "K" sound. The back of your tongue will touch the roof. While doing this, close your vocal folds at the back of your throat and lift your larynx (voice box) up to push the air out of your mouth and into your nose.  ------------------------------------------------------------------------ If all this fails despite extensive efforts then you need to go to an ear  nose and throat for surgical correction - but this should not be tried until everything else fails.        [x]  RETURN TO CLINIC:  see Dr. in 1-2 weeks, especially if not resolved  - If you are not doing well: RETURN to the office sooner. - Please bring all your medicines to each appointment.  - If your condition begins to worsen or become severe:  GO to the ER.

## 2022-04-29 ENCOUNTER — Ambulatory Visit: Payer: Managed Care, Other (non HMO) | Admitting: Family Medicine

## 2022-04-29 VITALS — BP 120/70 | HR 87 | Temp 97.8°F | Ht 68.0 in | Wt 207.0 lb

## 2022-04-29 DIAGNOSIS — J329 Chronic sinusitis, unspecified: Secondary | ICD-10-CM

## 2022-04-29 DIAGNOSIS — J3089 Other allergic rhinitis: Secondary | ICD-10-CM | POA: Diagnosis not present

## 2022-04-29 MED ORDER — DOXYCYCLINE HYCLATE 100 MG PO TABS: 100.0000 mg | ORAL_TABLET | Freq: Two times a day (BID) | ORAL | 0 refills | Status: AC

## 2022-04-29 MED ORDER — AZELASTINE HCL 0.1 % NA SOLN: 1.0000 | Freq: Two times a day (BID) | NASAL | 1 refills | Status: DC

## 2022-04-29 NOTE — Patient Instructions (Signed)
Please follow up as scheduled for your next visit with me: 08/12/2022   If you have any questions or concerns, please don't hesitate to send me a message via MyChart or call the office at [phone]. Thank you for visiting with [place] today! It's our pleasure caring for you.   You may add sudafed, short acting as needed, to see if this can help some of your ear symptoms.

## 2022-04-29 NOTE — Progress Notes (Signed)
Subjective  CC:  Chief Complaint  Patient presents with   Follow-up    Pt returning to F/U with sinus infection and stated that it has not gotten any better      HPI: Karl [name] is a 66 y.o. male who presents to the office today to address the problems listed above in the chief complaint. I have reviewed recent OV  for sinus infection. 11/20. He has taken the prednisone and is completing the augmentin, however, he reports that his symptoms are unchanged.  He mainly complains of right ear blockage/fullness without pain, sinus congestion, white thick nasal drainage, postnasal drainage and mildly irritated throat, and little cough due to the drainage.  He has no fevers, malaise, sinus or dental pain.  No shortness of breath.  He does have a history of recurrent sinusitis.  He has seen ENT in the past, they were considering sinus surgery but he was able to take an over-the-counter supplement called clear lungs and this helped resolve his problem.  He has not had a sinus infection in a long while.  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  He does suffer some from allergies although he does not feel this is very allergic.  He admits to some repetitive sneezing.  The prednisone did not help with his symptoms.  Assessment  1. Recurrent sinusitis   2. Non-seasonal allergic rhinitis, unspecified trigger      Plan  Possibly recurrent sinusitis versus ETD and allergies: Education given.  Given the need for improvement quickly because of his profession, will retreat with doxycycline 100 twice daily for 10 days, add Astelin nasal spray.  He will continue Flonase nasal spray.  Add over-the-counter Sudafed intermittently and cautiously given his hypertension which has been well controlled.  Work note to be out of work this week printed.  Hopefully this will resolve his symptoms.  Follow up: As scheduled 08/12/2022  No orders of the defined types were  placed in this encounter.  Meds ordered this encounter  Medications   doxycycline (VIBRA-TABS) 100 MG tablet    Sig: Take 1 tablet (100 mg total) by mouth 2 (two) times daily for 10 days.    Dispense:  20 tablet    Refill:  0   azelastine (ASTELIN) 0.1 % nasal spray    Sig: Place 1 spray into both nostrils 2 (two) times daily.    Dispense:  30 mL    Refill:  1      I reviewed the patients updated PMH, FH, and SocHx.    Patient Active Problem List   Diagnosis Date Noted   Combined hyperlipidemia associated with type 2 diabetes mellitus (HCC) 08/04/2021    Priority: High   Ureteral calculi 08/04/2021    Priority: High   Controlled type 2 diabetes mellitus without complication, without long-term current use of insulin (HCC) 12/10/2017    Priority: High   Obesity (BMI 30.0-34.9) 09/16/2017    Priority: High   Lumbar herniated disc 09/16/2017    Priority: Medium    Left lumbar radiculopathy 09/16/2017    Priority: Medium    Osteoarthritis, hand 09/16/2017    Priority: Medium    Recurrent sinusitis 12/16/2015    Priority: Medium    Degenerative tear of left medial meniscus 12/10/2017    Priority: Low   Heat rash 09/16/2017    Priority: Low   Non-seasonal allergic rhinitis 12/16/2015    Priority: Low   Current  Meds  Medication Sig   augmented betamethasone dipropionate (DIPROLENE-AF) 0.05 % cream APPLY A THIN FILM EXTERNALLY TO AFFECTED AREA TWICE A DAY AS NEEDED   azelastine (ASTELIN) 0.1 % nasal spray Place 1 spray into both nostrils 2 (two) times daily.   Coenzyme Q10 (CO Q 10) 10 MG CAPS Take by mouth.   doxycycline (VIBRA-TABS) 100 MG tablet Take 1 tablet (100 mg total) by mouth 2 (two) times daily for 10 days.   empagliflozin (JARDIANCE) 10 MG TABS tablet Take 1 tablet (10 mg total) by mouth daily before breakfast.   fenofibrate 160 MG tablet TAKE ONE TABLET BY MOUTH DAILY   fluticasone (FLONASE) 50 MCG/ACT nasal spray Place 2 sprays into the nose daily.    ketoconazole (NIZORAL) 2 % cream Apply 1 application topically 2 (two) times daily as needed for irritation.   metFORMIN (GLUCOPHAGE) 1000 MG tablet TAKE ONE TABLET BY MOUTH TWICE A DAY WITH MEALS   Misc Natural Products (NEURIVA) CAPS    montelukast (SINGULAIR) 10 MG tablet Take 1 tablet (10 mg total) by mouth at bedtime.   Multiple Vitamins-Minerals (CENTRUM SILVER 50+MEN PO) Take by mouth.   Omega-3 Fatty Acids (FISH OIL) 1000 MG CAPS Take by mouth.   OVER THE COUNTER MEDICATION Clear Lungs   Potassium 99 MG TABS Take by mouth.   psyllium (METAMUCIL) 58.6 % powder    rosuvastatin (CRESTOR) 20 MG tablet Take 1 tablet (20 mg total) by mouth daily.   tamsulosin (FLOMAX) 0.4 MG CAPS capsule Take 0.4 mg by mouth daily.   [DISCONTINUED] amoxicillin-clavulanate (AUGMENTIN) 875-125 MG tablet Take 1 tablet by mouth 2 (two) times daily.    Allergies: Patient has No Known Allergies. Family History: Patient family history includes Arthritis in his father and maternal grandmother; Asthma in his mother; Cancer in his maternal grandfather, maternal grandmother, paternal grandfather, and paternal grandmother; Depression in his sister; Diabetes in his mother; Heart attack in his father; Heart disease in his father and paternal grandfather; Hyperlipidemia in his brother, father, and mother; Kidney disease in his mother; Mental illness in his sister; Miscarriages / Stillbirths in his mother; Stroke in his mother. Social History:  Patient  reports that he has quit smoking. He has never used smokeless tobacco. He reports that he does not currently use alcohol. He reports that he does not use drugs.  Review of Systems: Constitutional: Negative for fever malaise or anorexia Cardiovascular: negative for chest pain Respiratory: negative for SOB or persistent cough Gastrointestinal: negative for abdominal pain  Objective  Vitals: BP 120/70   Pulse 87   Temp 97.8 F (36.6 C)   Ht 5\' 8"  (1.727 m)   Wt 207 lb  (93.9 kg)   SpO2 98%   BMI 31.47 kg/m  General: no acute distress , A&Ox3 HEENT: PEERL, conjunctiva normal, neck is supple, right TM with serous effusion but no redness or bulging, left TM clear, nasal mucosa mildly erythematous with purulent drainage on the left.  No cervical lymphadenopathy, mild bilateral maxillary tenderness Cardiovascular:  RRR without murmur or gallop.  Respiratory:  Good breath sounds bilaterally, CTAB with normal respiratory effort Skin:  Warm, no rashes    Commons side effects, risks, benefits, and alternatives for medications and treatment plan prescribed today were discussed, and the patient expressed understanding of the given instructions. Patient is instructed to call or message via MyChart if he/she has any questions or concerns regarding our treatment plan. No barriers to understanding were identified. We discussed Red Flag symptoms  and signs in detail. Patient expressed understanding regarding what to do in case of urgent or emergency type symptoms.  Medication list was reconciled, printed and provided to the patient in AVS. Patient instructions and summary information was reviewed with the patient as documented in the AVS. This note was prepared with assistance of Dragon voice recognition software. Occasional wrong-word or sound-a-like substitutions may have occurred due to the inherent limitations of voice recognition software  This visit occurred during the SARS-CoV-2 public health emergency.  Safety protocols were in place, including screening questions prior to the visit, additional usage of staff PPE, and extensive cleaning of exam room while observing appropriate contact time as indicated for disinfecting solutions.

## 2022-05-07 ENCOUNTER — Ambulatory Visit: Payer: Managed Care, Other (non HMO) | Admitting: Family Medicine

## 2022-05-15 ENCOUNTER — Other Ambulatory Visit: Payer: Self-pay | Admitting: Family Medicine

## 2022-07-14 ENCOUNTER — Other Ambulatory Visit: Payer: Self-pay | Admitting: Urology

## 2022-07-20 NOTE — Progress Notes (Signed)
Talked with patient. Instructions given. Hx and meds reviewed. Arrival time  0600 NPO after MN. Wife or someone will be the driver.

## 2022-07-23 NOTE — H&P (Signed)
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 This was confirmed at his primary care physician's office and was seen on microscopy with 3-6 red blood cell per high-power field on 03/03/2021. He is due for PSA. Urinalysis negative today. Denies a history of nephrolithiasis. He smoked for 3 years in the past and has not smoked for 40 years. Did have an episode of gross hematuria about 1 month ago.   06/19/2021  Patient underwent a CT IVP that revealed a left proximal ureteral calculus with hydronephrosis as well as a right ureteropelvic junction calculus. Given these findings, he went to the operating room for bilateral ureteroscopy with laser lithotripsy and ureteral stent placement. There was a little bit of trauma to the distal right ureter and therefore left the stents for 2 weeks. He presents today for stent removal. He has been tolerating the stents well. He is a Insurance underwriter.   07/09/2021  Patient returns after undergoing a KUB and renal ultrasound. No obvious renal or ureteral calculi on KUB. Renal ultrasound showed no obvious hydronephrosis. There were some hyperechoic areas consistent with stone versus vascular calcification. As noted in my operative report, all stone fragments were removed that were large enough to be basket extracted and the only stones remaining were so tiny that they could not be basket extracted and were clinically insignificant.   01/05/22: Karl [name] presents for a 6 month follow up regarding nephrolithiasis. He is doing well and denies interval flank pain or discomfort. He has not seeen any stones pass. HE denies changes to his voiding habits. 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   07/09/2022  Patient has had no stone events since I last saw him. Urinalysis negative.     ALLERGIES: No Known Drug Allergies    MEDICATIONS: Metformin Hcl 1,000 mg tablet  Centrum  Coenzyme Q10  Fenofibrate  Montelukast  Sodium  Omega 3  Potassium 99 mg capsule  Rosuvastatin Calcium 20 mg tablet     GU PSH: Cysto Remove Stent FB Sim - 06/19/2021 Locm 300-399Mg/Ml Iodine,1Ml - 06/05/2021 Ureteroscopic laser litho - 06/06/2021       PSH Notes: Tracheostomy     NON-GU PSH: Tonsillectomy     GU PMH: Renal and ureteral calculus - 01/05/2022, - 07/09/2021, - 06/19/2021 Microscopic hematuria - 06/05/2021, - 05/07/2021 Encounter for Prostate Cancer screening - 05/07/2021    NON-GU PMH: Diabetes Type 2 Gout Hypercholesterolemia    FAMILY HISTORY: No Family History    SOCIAL HISTORY: Marital Status: Married Preferred Language: English; Race: White Current Smoking Status: Patient does not smoke anymore. Has not smoked since 05/01/1981.   Tobacco Use Assessment Completed: Used Tobacco in last 30 days? Drinks 4+ caffeinated drinks per day.    REVIEW OF SYSTEMS:    GU Review Male:   Patient denies frequent urination, hard to postpone urination, burning/ pain with urination, get up at night to urinate, leakage of urine, stream starts and stops, trouble starting your stream, have to strain to urinate , erection problems, and penile pain.  Gastrointestinal (Upper):   Patient denies nausea, vomiting, and indigestion/ heartburn.  Gastrointestinal (Lower):   Patient denies constipation and diarrhea.  Constitutional:   Patient denies fever, night sweats, weight loss, and fatigue.  Skin:   Patient denies skin rash/ lesion and itching.  Eyes:   Patient denies blurred vision and double vision.  Ears/ Nose/ Throat:   Patient denies sore throat  and sinus problems.  Hematologic/Lymphatic:   Patient denies swollen glands and easy bruising.  Cardiovascular:   Patient denies leg swelling and chest pains.  Respiratory:   Patient denies cough and shortness of breath.  Endocrine:   Patient denies excessive thirst.  Musculoskeletal:   Patient denies back pain and joint pain.  Neurological:   Patient denies headaches and  dizziness.  Psychologic:   Patient denies depression and anxiety.   VITAL SIGNS: None   MULTI-SYSTEM PHYSICAL EXAMINATION:    Constitutional: Well-nourished. No physical deformities. Normally developed. Good grooming.  Respiratory: No labored breathing, no use of accessory muscles.   Gastrointestinal: No mass, no tenderness, no rigidity, non obese abdomen.  Eyes: Normal conjunctivae. Normal eyelids.  Musculoskeletal: Normal gait and station of head and neck.     Complexity of Data:  Source Of History:  Patient  Records Review:   Previous Doctor Records, Previous Patient Records  Urine Test Review:   Urinalysis  X-Ray Review: KUB: Reviewed Films. Discussed With Patient.  C.T. Abdomen/Pelvis: Reviewed Films. Discussed With Patient.     05/07/21  PSA  Total PSA 0.66 ng/mL    PROCEDURES:         C.T. Urogram - 74176  1 to 2 mm stones in the left lower pole, approximately 3 to 4 mm stone in the right lower pole and punctate calculi on the right.      Patient confirmed No Neulasta OnPro Device.          KUB - [id number]  A single view of the abdomen is obtained.  About 3 mm radiopacity in the right lower pole of the kidney      Patient confirmed No Neulasta OnPro Device.            Urinalysis Dipstick Dipstick Cont'd  Color: Yellow Bilirubin: Neg mg/dL  Appearance: Clear Ketones: Neg mg/dL  Specific Gravity: 1.020 Blood: Neg ery/uL  pH: 5.5 Protein: Neg mg/dL  Glucose: 3+ mg/dL Urobilinogen: 0.2 mg/dL    Nitrites: Neg    Leukocyte Esterase: Neg leu/uL    ASSESSMENT:      ICD-10 Details  1 GU:   Renal calculus - N20.0 Chronic, Stable   PLAN:           Orders X-Rays: C.T. Stone Protocol Without I.V. Contrast  X-Ray Notes: . History:  Hematuria: Yes/No  Patient to see MD after exam: Yes/No  Previous exam: CT / IVP/ US/ KUB/ None  When:  Where:  Diabetic: Yes/ No  BUN/ Creatinine:  Date of last BUN Creatinine:  Weight in pounds:  Allergy- IV  Contrast: Yes/ No  Conflicting diabetic meds: Yes/ No  Diabetic Meds:  Prior Authorization #: [id number]           Schedule Return Visit/Planned Activity: 6 Months - Follow up MD, KUB          Document Letter(s):  Created for Patient: Clinical Summary         Notes:   Obtain 24-hour urinalysis   In order to prevent stone formation, I advised the patient to stay well hydrated, avoid diets high in salt, avoid diets high in animal protein, and to take in the normal recommended daily allowance of calcium.   We discussed the management of urinary stones. These options include observation, ureteroscopy, and shockwave lithotripsy. We discussed which options are relevant to these particular stones. We discussed the natural history of stones as well as the complications of untreated stones and the impact on  quality of life without treatment as well as with each of the above listed treatments. We also discussed the efficacy of each treatment in its ability to clear the stone burden. With any of these management options I discussed the signs and symptoms of infection and the need for emergent treatment should these be experienced. For each option we discussed the ability of each procedure to clear the patient of their stone burden.   For observation I described the risks which include but are not limited to silent renal damage, life-threatening infection, need for emergent surgery, failure to pass stone, and pain.   For ureteroscopy I described the risks which include heart attack, stroke, pulmonary embolus, death, bleeding, infection, damage to contiguous structures, positioning injury, ureteral stricture, ureteral avulsion, ureteral injury, need for ureteral stent, inability to perform ureteroscopy, need for an interval procedure, inability to clear stone burden, stent discomfort and pain.   For shockwave lithotripsy I described the risks which include arrhythmia, kidney contusion, kidney  hemorrhage, need for transfusion, pain, inability to break up stone, inability to pass stone fragments, Steinstrasse, infection associated with obstructing stones, need for different surgical procedure, need for repeat shockwave lithotripsy.    He is a Insurance underwriter. Stones are small and would likely pass. Normally recommend observation but stone extraction is an option, especially if the Karl [name] requires that to continue flying. If it is required, he will let me know and we can set him up for procedural intervention.   Patient elects ESL of right renal remnal calculus

## 2022-07-24 ENCOUNTER — Ambulatory Visit (HOSPITAL_COMMUNITY): Payer: Managed Care, Other (non HMO)

## 2022-07-24 ENCOUNTER — Encounter (HOSPITAL_BASED_OUTPATIENT_CLINIC_OR_DEPARTMENT_OTHER): Payer: Self-pay | Admitting: Urology

## 2022-07-24 ENCOUNTER — Encounter (HOSPITAL_BASED_OUTPATIENT_CLINIC_OR_DEPARTMENT_OTHER)
Admission: RE | Disposition: A | Discharge status: Home / Self Care | Payer: Self-pay | Source: Ambulatory Visit | Attending: Urology

## 2022-07-24 ENCOUNTER — Ambulatory Visit (HOSPITAL_BASED_OUTPATIENT_CLINIC_OR_DEPARTMENT_OTHER)
Admission: RE | Admit: 2022-07-24 | Discharge: 2022-07-24 | Disposition: A | Discharge status: Home / Self Care | Payer: Managed Care, Other (non HMO) | Source: Ambulatory Visit | Attending: Urology | Admitting: Urology

## 2022-07-24 ENCOUNTER — Other Ambulatory Visit: Payer: Self-pay

## 2022-07-24 DIAGNOSIS — Z87891 Personal history of nicotine dependence: Secondary | ICD-10-CM | POA: Insufficient documentation

## 2022-07-24 DIAGNOSIS — N132 Hydronephrosis with renal and ureteral calculous obstruction: Secondary | ICD-10-CM | POA: Insufficient documentation

## 2022-07-24 DIAGNOSIS — Z09 Encounter for follow-up examination after completed treatment for conditions other than malignant neoplasm: Secondary | ICD-10-CM | POA: Insufficient documentation

## 2022-07-24 HISTORY — PX: EXTRACORPOREAL SHOCK WAVE LITHOTRIPSY: SHX1557

## 2022-07-24 LAB — GLUCOSE, CAPILLARY: Glucose-Capillary: 156 mg/dL — ABNORMAL HIGH (ref 70–99)

## 2022-07-24 SURGERY — LITHOTRIPSY, ESWL
Anesthesia: LOCAL | Laterality: Right

## 2022-07-24 MED ORDER — TRAMADOL HCL 50 MG PO TABS: 50.0000 mg | ORAL_TABLET | Freq: Four times a day (QID) | ORAL | 0 refills | Status: DC | PRN

## 2022-07-24 MED ORDER — DIAZEPAM 5 MG PO TABS
ORAL_TABLET | ORAL | Status: AC
  Filled 2022-07-24: qty 2

## 2022-07-24 MED ORDER — DIPHENHYDRAMINE HCL 25 MG PO CAPS
ORAL_CAPSULE | ORAL | Status: AC
  Filled 2022-07-24: qty 1

## 2022-07-24 MED ORDER — DIAZEPAM 5 MG PO TABS
10.0000 mg | ORAL_TABLET | ORAL | Status: AC
  Administered 2022-07-24: 10 mg via ORAL

## 2022-07-24 MED ORDER — CIPROFLOXACIN HCL 500 MG PO TABS
500.0000 mg | ORAL_TABLET | ORAL | Status: AC
  Administered 2022-07-24: 500 mg via ORAL

## 2022-07-24 MED ORDER — DIPHENHYDRAMINE HCL 25 MG PO CAPS
25.0000 mg | ORAL_CAPSULE | ORAL | Status: AC
  Administered 2022-07-24: 25 mg via ORAL

## 2022-07-24 MED ORDER — CIPROFLOXACIN HCL 500 MG PO TABS
ORAL_TABLET | ORAL | Status: AC
  Filled 2022-07-24: qty 1

## 2022-07-24 MED ORDER — SODIUM CHLORIDE 0.9 % IV SOLN: INTRAVENOUS | Status: DC

## 2022-07-24 NOTE — Discharge Instructions (Signed)
°  Post Anesthesia Home Care Instructions ° °Activity: °Get plenty of rest for the remainder of the day. A responsible individual must stay with you for 24 hours following the procedure.  °For the next 24 hours, DO NOT: °-Drive a car °-Operate machinery °-Drink alcoholic beverages °-Take any medication unless instructed by your physician °-Make any legal decisions or sign important papers. ° °Meals: °Start with liquid foods such as gelatin or soup. Progress to regular foods as tolerated. Avoid greasy, spicy, heavy foods. If nausea and/or vomiting occur, drink only clear liquids until the nausea and/or vomiting subsides. Call your physician if vomiting continues. ° °Special Instructions/Symptoms: °Your throat may feel dry or sore from the anesthesia or the breathing tube placed in your throat during surgery. If this causes discomfort, gargle with warm salt water. The discomfort should disappear within 24 hours. °

## 2022-07-24 NOTE — Op Note (Signed)
[facility] Operative Note  Treating Physician: Remi Haggard, MD  Pre-op diagnosis: Right renal calculus  Post-op diagnosis: Same   Procedure: Right renal ESWL  [id number]  See Aris Everts OP note scanned into chart. Also because of the size, density, location and other factors that cannot be anticipated I feel this will likely be a staged procedure. This fact supersedes any indication in the scanned [place] stone operative note to the contrary.  [facility]

## 2022-07-24 NOTE — Interval H&P Note (Signed)
History and Physical Interval Note:  07/24/2022 8:09 AM  Karl [name]  has presented today for surgery, with the diagnosis of RIGHT RENAL CALCULI.  The various methods of treatment have been discussed with the patient and family. After consideration of risks, benefits and other options for treatment, the patient has consented to  Procedure(s) with comments: RIGHT EXTRACORPOREAL SHOCK WAVE LITHOTRIPSY ([facility]) (Right) - 42 MINS as a surgical intervention.  The patient's history has been reviewed, patient examined, no change in status, stable for surgery.  I have reviewed the patient's chart and labs.  Questions were answered to the patient's satisfaction.     Remi Haggard

## 2022-07-27 ENCOUNTER — Encounter (HOSPITAL_BASED_OUTPATIENT_CLINIC_OR_DEPARTMENT_OTHER): Payer: Self-pay | Admitting: Urology

## 2022-08-07 ENCOUNTER — Other Ambulatory Visit: Payer: Self-pay | Admitting: Family Medicine

## 2022-08-10 ENCOUNTER — Ambulatory Visit: Payer: Managed Care, Other (non HMO) | Admitting: Family Medicine

## 2022-08-10 VITALS — BP 116/80 | HR 83 | Temp 97.8°F | Ht 68.0 in | Wt 209.4 lb

## 2022-08-10 DIAGNOSIS — N201 Calculus of ureter: Secondary | ICD-10-CM

## 2022-08-10 DIAGNOSIS — E119 Type 2 diabetes mellitus without complications: Secondary | ICD-10-CM

## 2022-08-10 DIAGNOSIS — E1169 Type 2 diabetes mellitus with other specified complication: Secondary | ICD-10-CM | POA: Diagnosis not present

## 2022-08-10 DIAGNOSIS — M25511 Pain in right shoulder: Secondary | ICD-10-CM | POA: Diagnosis not present

## 2022-08-10 DIAGNOSIS — E782 Mixed hyperlipidemia: Secondary | ICD-10-CM

## 2022-08-10 LAB — POCT GLYCOSYLATED HEMOGLOBIN (HGB A1C): Hemoglobin A1C: 6.8 % — AB (ref 4.0–5.6)

## 2022-08-10 MED ORDER — ROSUVASTATIN CALCIUM 20 MG PO TABS: 20.0000 mg | ORAL_TABLET | Freq: Every day | ORAL | 3 refills | Status: AC

## 2022-08-10 NOTE — Progress Notes (Signed)
Subjective  CC:  Chief Complaint  Patient presents with   Diabetes   Hyperlipidemia    HPI: Karl [name] is a 67 y.o. male who presents to the office today for follow up of diabetes and problems listed above in the chief complaint.  Diabetes follow up: His diabetic control is reported as Unchanged. Snacking a bit more.  He denies exertional CP or SOB or symptomatic hypoglycemia. He denies foot sores or paresthesias. Normotensive not on ace HLD on crestor 20; due refill. Had been using extras from his wife. No AE Reviewed urology: had lithotripsy for stones Seeing PT in [place] for shoulder and back and has worked wonders.   Wt Readings from Last 3 Encounters:  08/10/22 209 lb 6.4 oz (95 kg)  07/24/22 208 lb 11.2 oz (94.7 kg)  04/29/22 207 lb (93.9 kg)    BP Readings from Last 3 Encounters:  08/10/22 116/80  07/24/22 130/87  04/29/22 120/70    Assessment  1. Controlled type 2 diabetes mellitus without complication, without long-term current use of insulin ([place])   2. Combined hyperlipidemia associated with type 2 diabetes mellitus ([place])   3. Right shoulder pain, unspecified chronicity   4. Ureteral calculi      Plan  Diabetes is currently well controlled. Conitnue jardiance and met.  HLD at goal. Refilled crestor Continue with pt Had f/u with urology today for kidney stones. Started low oxalate diet.   Follow up: 6 mo for cpe. Orders Placed This Encounter  Procedures   POCT HgB A1C   Meds ordered this encounter  Medications   rosuvastatin (CRESTOR) 20 MG tablet    Sig: Take 1 tablet (20 mg total) by mouth daily.    Dispense:  90 tablet    Refill:  3      Immunization History  Administered Date(s) Administered   Fluad Quad(high Dose 65+) 02/12/2022   Influenza Inj Mdck Quad Pf 03/08/2017, 01/27/2018   Influenza, Quadrivalent, Recombinant, Inj, Pf 03/27/2020   Influenza,inj,Quad PF,6+ Mos 02/27/2021   Moderna Covid-19 Vaccine Bivalent Booster 73yr &  up 05/20/2021   PFIZER(Purple Top)SARS-COV-2 Vaccination 08/15/2019, 09/01/2019, 03/20/2020   PNEUMOCOCCAL CONJUGATE-20 05/09/2021   Pneumococcal Polysaccharide-23 03/21/2018   Tdap 06/01/2013   Zoster Recombinat (Shingrix) 12/10/2017, 06/22/2018    Diabetes Related Lab Review: Lab Results  Component Value Date   HGBA1C 6.8 (A) 08/10/2022   HGBA1C 6.4 (A) 02/12/2022   HGBA1C 6.1 08/04/2021    Lab Results  Component Value Date   MICROALBUR <0.7 02/12/2022   Lab Results  Component Value Date   CREATININE 1.05 02/12/2022   BUN 18 02/12/2022   NA 139 02/12/2022   K 4.0 02/12/2022   CL 101 02/12/2022   CO2 30 02/12/2022   Lab Results  Component Value Date   CHOL 118 02/12/2022   CHOL 128 05/09/2021   CHOL 133 10/23/2020   Lab Results  Component Value Date   HDL 44.00 02/12/2022   HDL 48.60 05/09/2021   HDL 41.50 10/23/2020   Lab Results  Component Value Date   LDLCALC 47 02/12/2022   LDLCALC 43 05/09/2021   LDLCALC 60 10/23/2020   Lab Results  Component Value Date   TRIG 138.0 02/12/2022   TRIG 182.0 (H) 05/09/2021   TRIG 157.0 (H) 10/23/2020   Lab Results  Component Value Date   CHOLHDL 3 02/12/2022   CHOLHDL 3 05/09/2021   CHOLHDL 3 10/23/2020   No results found for: "LDLDIRECT" The ASCVD Risk score (Arnett  DK, et al., 2019) failed to calculate for the following reasons:   The valid total cholesterol range is 130 to 320 mg/dL I have reviewed the PMH, Fam and Soc history. Patient Active Problem List   Diagnosis Date Noted   Combined hyperlipidemia associated with type 2 diabetes mellitus ([place]) 08/04/2021    Priority: High   Ureteral calculi 08/04/2021    Priority: High    Work-up for microscopic hematuria: Ureteral calculi with right hydronephrosis: Status post stone removal and stent placement.  Normal follow-up 07/2021.  Dr. Gloriann Loan, urology    Controlled type 2 diabetes mellitus without complication, without long-term current use of insulin ([place])  12/10/2017    Priority: High   Obesity (BMI 30.0-34.9) 09/16/2017    Priority: High   Lumbar herniated disc 09/16/2017    Priority: Medium     L3,4,5 by MRI, NeuroSurgery    Left lumbar radiculopathy 09/16/2017    Priority: Medium     Evaluated by NS and treated with PT, [facility].     Osteoarthritis, hand 09/16/2017    Priority: Medium    Recurrent sinusitis 12/16/2015    Priority: Medium    Degenerative tear of left medial meniscus 12/10/2017    Priority: Low    Dr. Maureen Ralphs    Heat rash 09/16/2017    Priority: Low    Recurring;     Non-seasonal allergic rhinitis 12/16/2015    Priority: Low    Social History: Patient  reports that he has quit smoking. He has never used smokeless tobacco. He reports that he does not currently use alcohol. He reports that he does not use drugs.  Review of Systems: Ophthalmic: negative for eye pain, loss of vision or double vision Cardiovascular: negative for chest pain Respiratory: negative for SOB or persistent cough Gastrointestinal: negative for abdominal pain Genitourinary: negative for dysuria or gross hematuria MSK: negative for foot lesions Neurologic: negative for weakness or gait disturbance  Objective  Vitals: BP 116/80   Pulse 83   Temp 97.8 F (36.6 C)   Ht '5\' 8"'$  (1.727 m)   Wt 209 lb 6.4 oz (95 kg)   SpO2 98%   BMI 31.84 kg/m  General: well appearing, no acute distress  Psych:  Alert and oriented, normal mood and affect HEENT:  Normocephalic, atraumatic, moist mucous membranes, supple neck  Cardiovascular:  Nl S1 and S2, RRR without murmur, gallop or rub. no edema Respiratory:  Good breath sounds bilaterally, CTAB with normal effort, no rales   Diabetic education: ongoing education regarding chronic disease management for diabetes was given today. We continue to reinforce the ABC's of diabetic management: A1c (<7 or 8 dependent upon patient), tight blood pressure control, and cholesterol management with goal LDL <  100 minimally. We discuss diet strategies, exercise recommendations, medication options and possible side effects. At each visit, we review recommended immunizations and preventive care recommendations for diabetics and stress that good diabetic control can prevent other problems. See below for this patient's data.   Commons side effects, risks, benefits, and alternatives for medications and treatment plan prescribed today were discussed, and the patient expressed understanding of the given instructions. Patient is instructed to call or message via MyChart if he/she has any questions or concerns regarding our treatment plan. No barriers to understanding were identified. We discussed Red Flag symptoms and signs in detail. Patient expressed understanding regarding what to do in case of urgent or emergency type symptoms.  Medication list was reconciled, printed and provided to the  patient in AVS. Patient instructions and summary information was reviewed with the patient as documented in the AVS. This note was prepared with assistance of Dragon voice recognition software. Occasional wrong-word or sound-a-like substitutions may have occurred due to the inherent limitations of voice recognition software

## 2022-08-10 NOTE — Patient Instructions (Signed)
Please return in September for your annual complete physical; please come fasting.   If you have any questions or concerns, please don't hesitate to send me a message via MyChart or call the office at [phone]. Thank you for visiting with [place] today! It's our pleasure caring for you.

## 2022-08-12 ENCOUNTER — Ambulatory Visit: Payer: Managed Care, Other (non HMO) | Admitting: Family Medicine

## 2022-08-14 LAB — HM DIABETES EYE EXAM

## 2022-10-09 ENCOUNTER — Ambulatory Visit: Payer: Managed Care, Other (non HMO) | Admitting: Family

## 2022-10-12 ENCOUNTER — Encounter: Payer: Self-pay | Admitting: Physician Assistant

## 2022-10-12 ENCOUNTER — Ambulatory Visit: Payer: Managed Care, Other (non HMO) | Admitting: Family Medicine

## 2022-10-12 VITALS — BP 126/80 | HR 70 | Temp 96.8°F | Ht 68.0 in | Wt 205.8 lb

## 2022-10-12 DIAGNOSIS — H6690 Otitis media, unspecified, unspecified ear: Secondary | ICD-10-CM

## 2022-10-12 MED ORDER — BENZONATATE 100 MG PO CAPS: 100.0000 mg | ORAL_CAPSULE | Freq: Two times a day (BID) | ORAL | 0 refills | Status: DC | PRN

## 2022-10-12 MED ORDER — CEFDINIR 300 MG PO CAPS: 300.0000 mg | ORAL_CAPSULE | Freq: Two times a day (BID) | ORAL | 0 refills | Status: DC

## 2022-10-12 NOTE — Patient Instructions (Signed)
It was great to see you!  Stop amoxicillin antibiotic Start new antibiotic, Omnicef Continue steroids   Additional recommendations Restart Singulair 10mg  daily at night.  Start Nasal saline spray (i.e., Simply Saline) or nasal saline lavage (i.e., NeilMed) is recommended as needed and prior to medicated nasal sprays. Continue Flonase 1 spray twice a day. Use tessalon perles as needed during the day for cough (keep away from children - can be toxic!). If tessalon perles ineffective, trial 12-hour delsym over the counter cough syrup (generic is fine!)     Take care,  Jarold Motto PA-C

## 2022-10-12 NOTE — Progress Notes (Signed)
Karl [name] is a 67 y.o. male here for a follow up of a pre-existing problem.  History of Present Illness:   Chief Complaint  Patient presents with   Sinus Problem    Pt c/o Cough, left ear fullness and Right eye redness/itchiness, sx started 5/5. Seen at [facility] and was given prednisone and amoxicillin On 5/10. Pt was in [place] prior to sx.     Started Augmentin and prednisone on 5/10 from urgent care - was diagnosed with left AOM. He is taking prescription as prescribed Endorses some improvement of symptom(s)  Did also have some concerns about possible Right eye infection Had right eye crusting, redness of lower lid and discomfort of lower lid but this has had slight improvement  Ears feel blocked and now he has symptom(s) in left ear that he did not have before Was told to stop singulair by urgent care provider Use Flonase nasal spray in morning and night   Past Medical History:  Diagnosis Date   Arthritis    Chicken pox    Degenerative tear of left medial meniscus 12/10/2017   Dr. Despina Hick   Diet-controlled diabetes mellitus (HCC) 12/10/2017   a1c 6.05 Jun 2016 and July 2019   High triglycerides    Hyperlipidemia    Left lumbar radiculopathy 09/16/2017   Evaluated by NS and treated with PT, [facility].    Lumbar herniated disc 09/16/2017   L3,4,5 by MRI, NeuroSurgery   Microscopic hematuria 05/09/2021   Neurology evaluation, Dr. Alvester Morin, December 2022   Personal history of kidney stones      Social History   Tobacco Use   Smoking status: Former   Smokeless tobacco: Never  Vaping Use   Vaping Use: Never used  Substance Use Topics   Alcohol use: Not Currently   Drug use: Never    Past Surgical History:  Procedure Laterality Date   EXTRACORPOREAL SHOCK WAVE LITHOTRIPSY Right 07/24/2022   Procedure: RIGHT EXTRACORPOREAL SHOCK WAVE LITHOTRIPSY (ESWL);  Surgeon: Belva Agee, MD;  Location: [facility];  Service: Urology;  Laterality: Right;  76  MINS   KIDNEY STONE SURGERY  06/06/2021   Dr. Modena Slater   TONSILECTOMY, ADENOIDECTOMY, BILATERAL MYRINGOTOMY AND TUBES     1964   TRACHEOSTOMY     1959    Family History  Problem Relation Age of Onset   Asthma Mother    Diabetes Mother    Hyperlipidemia Mother    Kidney disease Mother    Miscarriages / [place] Mother    Stroke Mother    Arthritis Father    Heart disease Father    Hyperlipidemia Father    Heart attack Father    Depression Sister    Mental illness Sister    Hyperlipidemia Brother    Arthritis Maternal Grandmother    Cancer Maternal Grandmother    Cancer Maternal Grandfather    Cancer Paternal Grandmother    Cancer Paternal Grandfather    Heart disease Paternal Grandfather     No Known Allergies  Current Medications:   Current Outpatient Medications:    augmented betamethasone dipropionate (DIPROLENE-AF) 0.05 % cream, APPLY A THIN FILM EXTERNALLY TO AFFECTED AREA TWICE A DAY AS NEEDED, Disp: 30 g, Rfl: 2   Coenzyme Q10 (CO Q 10) 10 MG CAPS, Take by mouth., Disp: , Rfl:    fenofibrate 160 MG tablet, TAKE 1 TABLET BY MOUTH DAILY, Disp: 90 tablet, Rfl: 1   fenoprofen (NALFON) 600 MG TABS tablet, Take 600  mg by mouth., Disp: , Rfl:    fluticasone (FLONASE) 50 MCG/ACT nasal spray, Place 2 sprays into the nose daily., Disp: , Rfl:    JARDIANCE 10 MG TABS tablet, TAKE ONE TABLET BY MOUTH DAILY BEFORE BREAKFAST, Disp: 90 tablet, Rfl: 3   ketoconazole (NIZORAL) 2 % cream, Apply 1 application topically 2 (two) times daily as needed for irritation., Disp: 30 g, Rfl: 2   metFORMIN (GLUCOPHAGE) 1000 MG tablet, TAKE ONE TABLET BY MOUTH TWICE A DAY WITH MEALS, Disp: 180 tablet, Rfl: 3   Misc Natural Products (NEURIVA) CAPS, , Disp: , Rfl:    montelukast (SINGULAIR) 10 MG tablet, Take 1 tablet (10 mg total) by mouth at bedtime., Disp: 30 tablet, Rfl: 3   Multiple Vitamins-Minerals (CENTRUM SILVER 50+MEN PO), Take by mouth., Disp: , Rfl:    Omega-3 Fatty Acids (FISH  OIL) 1000 MG CAPS, Take by mouth., Disp: , Rfl:    OVER THE COUNTER MEDICATION, Clear Lungs, Disp: , Rfl:    rosuvastatin (CRESTOR) 20 MG tablet, Take 1 tablet (20 mg total) by mouth daily., Disp: 90 tablet, Rfl: 3   tamsulosin (FLOMAX) 0.4 MG CAPS capsule, Take 0.4 mg by mouth daily., Disp: , Rfl:    Review of Systems:   ROS Negative unless otherwise specified per HPI.  Vitals:   Vitals:   10/12/22 1110  BP: 126/80  Pulse: 70  Temp: (!) 96.8 F (36 C)  TempSrc: Temporal  SpO2: 96%  Weight: 205 lb 12.8 oz (93.4 kg)  Height: 5\' 8"  (1.727 m)     Body mass index is 31.29 kg/m.  Physical Exam:   Physical Exam Vitals and nursing note reviewed.  Constitutional:      General: He is not in acute distress.    Appearance: He is well-developed. He is not ill-appearing or toxic-appearing.  HENT:     Head: Normocephalic and atraumatic.     Right Ear: Ear canal and external ear normal. A middle ear effusion is present. Tympanic membrane is erythematous. Tympanic membrane is not retracted or bulging.     Left Ear: Ear canal and external ear normal. A middle ear effusion is present. Tympanic membrane is erythematous. Tympanic membrane is not retracted or bulging.     Nose: Nose normal.     Right Sinus: No maxillary sinus tenderness or frontal sinus tenderness.     Left Sinus: No maxillary sinus tenderness or frontal sinus tenderness.     Mouth/Throat:     Pharynx: Uvula midline. No posterior oropharyngeal erythema.  Eyes:     General: Lids are normal.     Conjunctiva/sclera: Conjunctivae normal.  Neck:     Trachea: Trachea normal.  Cardiovascular:     Rate and Rhythm: Normal rate and regular rhythm.     Pulses: Normal pulses.     Heart sounds: Normal heart sounds, S1 normal and S2 normal.  Pulmonary:     Effort: Pulmonary effort is normal.     Breath sounds: Normal breath sounds. No decreased breath sounds, wheezing, rhonchi or rales.  Lymphadenopathy:     Cervical: No cervical  adenopathy.  Skin:    General: Skin is warm and dry.  Neurological:     Mental Status: He is alert.     GCS: GCS eye subscore is 4. GCS verbal subscore is 5. GCS motor subscore is 6.  Psychiatric:        Speech: Speech normal.        Behavior: Behavior normal. Behavior is  cooperative.     Assessment and Plan:   Acute otitis media, unspecified otitis media type Ongoing While he does endorse some improvement of symptoms, he notes that his ear pain has worsened bilaterally Will change Augmentin to Omnicef Continue steroids and Flonase Add Tessalon Perles for cough Restart Singulair Follow-up if any new or worsening symptoms     Jarold Motto, PA-C

## 2022-10-19 ENCOUNTER — Encounter: Payer: Self-pay | Admitting: Family Medicine

## 2022-10-19 ENCOUNTER — Ambulatory Visit: Payer: Managed Care, Other (non HMO) | Admitting: Family Medicine

## 2022-10-19 VITALS — BP 140/84 | HR 97 | Temp 97.8°F | Ht 68.0 in | Wt 203.2 lb

## 2022-10-19 DIAGNOSIS — R030 Elevated blood-pressure reading, without diagnosis of hypertension: Secondary | ICD-10-CM

## 2022-10-19 DIAGNOSIS — E119 Type 2 diabetes mellitus without complications: Secondary | ICD-10-CM | POA: Diagnosis not present

## 2022-10-19 DIAGNOSIS — H6993 Unspecified Eustachian tube disorder, bilateral: Secondary | ICD-10-CM | POA: Diagnosis not present

## 2022-10-19 NOTE — Progress Notes (Signed)
Subjective  CC:  Chief Complaint  Patient presents with   Ear Pain    Lt ear block from sinus pressure   Cough    Slight cough with clear flem    HPI: Karl [name] is a 67 y.o. male who presents to the office today to address the problems listed above in the chief complaint. 67 year old with history of recurrent sinusitis and allergies/eustachian tube dysfunction dealing with ear infections.  I reviewed last 2 notes from urgent care and office visit here.  Treated with Augmentin and prednisone and then Omnicef.  No sinus pain.  Complains of left ear fullness and pressure.  Some postnasal drainage and cough.  Does not feel sick.  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  He has history of ENT visits where they were thinking about doing surgery to help.  Typically gets this problem about once a year.  Currently taking Zyrtec and Flonase.  Restarting Singulair.  Not taking decongestants.  He does have diabetes.  Does not feel that the prednisone made a significant difference.  He does not have hypertension typically.  Assessment  1. ETD (Eustachian tube dysfunction), bilateral   2. Controlled type 2 diabetes mellitus without complication, without long-term current use of insulin (HCC)   3. Elevated blood pressure reading without diagnosis of hypertension      Plan  Recent otitis media, allergies and eustachian tube dysfunction: Reassured that infection seems to have resolved.  No secondary sinusitis by symptoms.  Recommend continuing treatment with Zyrtec, Flonase, add Astelin.  Continue Singulair and consider decongestants.  Discussed Valsalva maneuver.  Should improve over the next few weeks.  Consider ENT reevaluation if needed.  Discussed symptoms of sinus infection.  Out of work until improved. He will monitor blood pressures at home. Defer further steroids given unclear benefit and risk of hypoglycemia.  Patient agrees  Follow up: As  scheduled 02/18/2023  No orders of the defined types were placed in this encounter.  No orders of the defined types were placed in this encounter.     I reviewed the patients updated PMH, FH, and SocHx.    Patient Active Problem List   Diagnosis Date Noted   Combined hyperlipidemia associated with type 2 diabetes mellitus (HCC) 08/04/2021    Priority: High   Ureteral calculi 08/04/2021    Priority: High   Controlled type 2 diabetes mellitus without complication, without long-term current use of insulin (HCC) 12/10/2017    Priority: High   Obesity (BMI 30.0-34.9) 09/16/2017    Priority: High   Lumbar herniated disc 09/16/2017    Priority: Medium    Left lumbar radiculopathy 09/16/2017    Priority: Medium    Osteoarthritis, hand 09/16/2017    Priority: Medium    Recurrent sinusitis 12/16/2015    Priority: Medium    Degenerative tear of left medial meniscus 12/10/2017    Priority: Low   Heat rash 09/16/2017    Priority: Low   Non-seasonal allergic rhinitis 12/16/2015    Priority: Low   Current Meds  Medication Sig   augmented betamethasone dipropionate (DIPROLENE-AF) 0.05 % cream APPLY A THIN FILM EXTERNALLY TO AFFECTED AREA TWICE A DAY AS NEEDED   benzonatate (TESSALON) 100 MG capsule Take 1 capsule (100 mg total) by mouth 2 (two) times daily as needed for cough.   cefdinir (OMNICEF) 300 MG capsule Take 1 capsule (300 mg total) by mouth 2 (two) times daily.   Coenzyme Q10 (CO  Q 10) 10 MG CAPS Take by mouth.   fenofibrate 160 MG tablet TAKE 1 TABLET BY MOUTH DAILY   fenoprofen (NALFON) 600 MG TABS tablet Take 600 mg by mouth.   fluticasone (FLONASE) 50 MCG/ACT nasal spray Place 2 sprays into the nose daily.   JARDIANCE 10 MG TABS tablet TAKE ONE TABLET BY MOUTH DAILY BEFORE BREAKFAST   ketoconazole (NIZORAL) 2 % cream Apply 1 application topically 2 (two) times daily as needed for irritation.   metFORMIN (GLUCOPHAGE) 1000 MG tablet TAKE ONE TABLET BY MOUTH TWICE A DAY WITH  MEALS   Misc Natural Products (NEURIVA) CAPS    montelukast (SINGULAIR) 10 MG tablet Take 1 tablet (10 mg total) by mouth at bedtime.   Multiple Vitamins-Minerals (CENTRUM SILVER 50+MEN PO) Take by mouth.   Omega-3 Fatty Acids (FISH OIL) 1000 MG CAPS Take by mouth.   OVER THE COUNTER MEDICATION Clear Lungs   rosuvastatin (CRESTOR) 20 MG tablet Take 1 tablet (20 mg total) by mouth daily.   tamsulosin (FLOMAX) 0.4 MG CAPS capsule Take 0.4 mg by mouth daily.    Allergies: Patient has No Known Allergies. Family History: Patient family history includes Arthritis in his father and maternal grandmother; Asthma in his mother; Cancer in his maternal grandfather, maternal grandmother, paternal grandfather, and paternal grandmother; Depression in his sister; Diabetes in his mother; Heart attack in his father; Heart disease in his father and paternal grandfather; Hyperlipidemia in his brother, father, and mother; Kidney disease in his mother; Mental illness in his sister; Miscarriages / Stillbirths in his mother; Stroke in his mother. Social History:  Patient  reports that he has quit smoking. He has never used smokeless tobacco. He reports that he does not currently use alcohol. He reports that he does not use drugs.  Review of Systems: Constitutional: Negative for fever malaise or anorexia Cardiovascular: negative for chest pain Respiratory: negative for SOB or persistent cough Gastrointestinal: negative for abdominal pain  Objective  Vitals: BP (!) 140/84   Pulse 97   Temp 97.8 F (36.6 C)   Ht 5\' 8"  (1.727 m)   Wt 203 lb 3.2 oz (92.2 kg)   SpO2 100%   BMI 30.90 kg/m  General: no acute distress , A&Ox3 appears well HEENT: PEERL, conjunctiva normal, neck is supple, right TM normal, left TM with serous effusion without erythema and with normal landmarks Cardiovascular:  RRR without murmur or gallop.  Respiratory:  Good breath sounds bilaterally, CTAB with normal respiratory effort Skin:   Warm, no rashes  Commons side effects, risks, benefits, and alternatives for medications and treatment plan prescribed today were discussed, and the patient expressed understanding of the given instructions. Patient is instructed to call or message via MyChart if he/she has any questions or concerns regarding our treatment plan. No barriers to understanding were identified. We discussed Red Flag symptoms and signs in detail. Patient expressed understanding regarding what to do in case of urgent or emergency type symptoms.  Medication list was reconciled, printed and provided to the patient in AVS. Patient instructions and summary information was reviewed with the patient as documented in the AVS. This note was prepared with assistance of Dragon voice recognition software. Occasional wrong-word or sound-a-like substitutions may have occurred due to the inherent limitations of voice recognition software

## 2022-10-19 NOTE — Patient Instructions (Signed)
Please follow up as scheduled for your next visit with me: 02/18/2023   If you have any questions or concerns, please don't hesitate to send me a message via MyChart or call the office at [phone]. Thank you for visiting with [place] today! It's our pleasure caring for you.

## 2022-10-28 ENCOUNTER — Encounter: Payer: Self-pay | Admitting: Family Medicine

## 2023-01-14 ENCOUNTER — Encounter (INDEPENDENT_AMBULATORY_CARE_PROVIDER_SITE_OTHER): Payer: Self-pay

## 2023-02-04 ENCOUNTER — Other Ambulatory Visit: Payer: Self-pay | Admitting: Family Medicine

## 2023-02-13 ENCOUNTER — Other Ambulatory Visit: Payer: Self-pay | Admitting: Family Medicine

## 2023-02-18 ENCOUNTER — Encounter: Payer: Self-pay | Admitting: Family Medicine

## 2023-02-18 ENCOUNTER — Ambulatory Visit: Payer: Managed Care, Other (non HMO) | Admitting: Family Medicine

## 2023-02-18 VITALS — BP 120/78 | HR 75 | Temp 97.7°F | Ht 68.0 in | Wt 210.0 lb

## 2023-02-18 DIAGNOSIS — Z0001 Encounter for general adult medical examination with abnormal findings: Secondary | ICD-10-CM

## 2023-02-18 DIAGNOSIS — Z7984 Long term (current) use of oral hypoglycemic drugs: Secondary | ICD-10-CM

## 2023-02-18 DIAGNOSIS — E1169 Type 2 diabetes mellitus with other specified complication: Secondary | ICD-10-CM

## 2023-02-18 DIAGNOSIS — Z0000 Encounter for general adult medical examination without abnormal findings: Secondary | ICD-10-CM

## 2023-02-18 DIAGNOSIS — N201 Calculus of ureter: Secondary | ICD-10-CM

## 2023-02-18 DIAGNOSIS — E782 Mixed hyperlipidemia: Secondary | ICD-10-CM

## 2023-02-18 DIAGNOSIS — E669 Obesity, unspecified: Secondary | ICD-10-CM | POA: Diagnosis not present

## 2023-02-18 DIAGNOSIS — E119 Type 2 diabetes mellitus without complications: Secondary | ICD-10-CM

## 2023-02-18 LAB — CBC WITH DIFFERENTIAL/PLATELET
Basophils Absolute: 0.1 10*3/uL (ref 0.0–0.1)
Basophils Relative: 1 % (ref 0.0–3.0)
Eosinophils Absolute: 0.4 10*3/uL (ref 0.0–0.7)
Eosinophils Relative: 4.7 % (ref 0.0–5.0)
HCT: 49.9 % (ref 39.0–52.0)
Hemoglobin: 16.6 g/dL (ref 13.0–17.0)
Lymphocytes Relative: 22.3 % (ref 12.0–46.0)
Lymphs Abs: 1.9 10*3/uL (ref 0.7–4.0)
MCHC: 33.4 g/dL (ref 30.0–36.0)
MCV: 87.8 fl (ref 78.0–100.0)
Monocytes Absolute: 0.6 10*3/uL (ref 0.1–1.0)
Monocytes Relative: 7.1 % (ref 3.0–12.0)
Neutro Abs: 5.6 10*3/uL (ref 1.4–7.7)
Neutrophils Relative %: 64.9 % (ref 43.0–77.0)
Platelets: 249 10*3/uL (ref 150.0–400.0)
RBC: 5.68 Mil/uL (ref 4.22–5.81)
RDW: 13.5 % (ref 11.5–15.5)
WBC: 8.6 10*3/uL (ref 4.0–10.5)

## 2023-02-18 LAB — COMPREHENSIVE METABOLIC PANEL
ALT: 25 U/L (ref 0–53)
AST: 25 U/L (ref 0–37)
Albumin: 4.3 g/dL (ref 3.5–5.2)
Alkaline Phosphatase: 38 U/L — ABNORMAL LOW (ref 39–117)
BUN: 19 mg/dL (ref 6–23)
CO2: 31 mEq/L (ref 19–32)
Calcium: 9.5 mg/dL (ref 8.4–10.5)
Chloride: 102 mEq/L (ref 96–112)
Creatinine, Ser: 1.19 mg/dL (ref 0.40–1.50)
GFR: 63.77 mL/min (ref >60.00)
Glucose, Bld: 145 mg/dL — ABNORMAL HIGH (ref 70–99)
Potassium: 4.4 mEq/L (ref 3.5–5.1)
Sodium: 140 mEq/L (ref 135–145)
Total Bilirubin: 0.6 mg/dL (ref 0.2–1.2)
Total Protein: 7.1 g/dL (ref 6.0–8.3)

## 2023-02-18 LAB — LIPID PANEL
Cholesterol: 122 mg/dL (ref 0–200)
HDL: 47.6 mg/dL (ref >39.00)
LDL Cholesterol: 50 mg/dL (ref 0–99)
NonHDL: 74.65
Total CHOL/HDL Ratio: 3
Triglycerides: 122 mg/dL (ref 0.0–149.0)
VLDL: 24.4 mg/dL (ref 0.0–40.0)

## 2023-02-18 LAB — MICROALBUMIN / CREATININE URINE RATIO
Creatinine,U: 58.3 mg/dL
Microalb Creat Ratio: 1.2 mg/g (ref 0.0–30.0)
Microalb, Ur: <0.7 mg/dL (ref 0.0–1.9)

## 2023-02-18 LAB — HEMOGLOBIN A1C: Hgb A1c MFr Bld: 7.6 % — ABNORMAL HIGH (ref 4.6–6.5)

## 2023-02-18 LAB — TSH: TSH: 1.53 u[IU]/mL (ref 0.35–5.50)

## 2023-02-18 MED ORDER — EMPAGLIFLOZIN 10 MG PO TABS: 10.0000 mg | ORAL_TABLET | Freq: Every day | ORAL | 3 refills | Status: DC

## 2023-02-18 NOTE — Progress Notes (Signed)
Subjective  Chief Complaint  Patient presents with   Annual Exam    Pt here for annual exam and presents with fatigue for the last month, during the day pt takes more naps, gets enough sleep during the night. Fasting today   Diabetes    Hx of diabetes     HPI: Karl [name] is a 67 y.o. male who presents to [facility] [place] today for a Male Wellness Visit. He also has the concerns and/or needs as listed above in the chief complaint. These will be addressed in addition to the Health Maintenance Visit.   Wellness Visit: annual visit with health maintenance review and exam   Health maintenance: Screens are current.  Defers flu shot today.  Will get at pharmacy with COVID-vaccine soon.  Sometimes gets achy and has to work tomorrow.  Diet is not been ideal due to travel and work schedule.  Body mass index is 31.93 kg/m. Wt Readings from Last 3 Encounters:  02/18/23 210 lb (95.3 kg)  10/19/22 203 lb 3.2 oz (92.2 kg)  10/12/22 205 lb 12.8 oz (93.4 kg)     Chronic disease management visit and/or acute problem visit: Diabetes: Fasting sugars are mildly elevated varying between 120s and 130s.  Limited exercise.  Diet is difficult from hotel rooms etc.  Tolerates his medicines, Jardiance 10 and metformin.  No foot concerns.  Eye exam current Hyperlipidemia on statin due for recheck.  Has been controlled.  No adverse effects Obesity: Weight is up.  Diet is worse as noted above.  Has not tried GLP-1's. Describes some fatigue.  Can doze off easily if sitting.  Notes at home this happens while he is watching TV etc.  Activity has been less than normal.  He says he sleeps well.  Some snoring but no noted apnea.  Awakens refreshed. Reviewed recent urology notes.  Cleared.  No recurrent stones  Assessment  1. Encounter for well adult exam with abnormal findings   2. Controlled type 2 diabetes mellitus without complication, without long-term current use of insulin (HCC)   3.  Combined hyperlipidemia associated with type 2 diabetes mellitus (HCC)   4. Obesity (BMI 30.0-34.9)   5. Diabetes mellitus treated with oral medication (HCC)   6. Ureteral calculi      Plan  Male Wellness Visit: Age appropriate Health Maintenance and Prevention measures were discussed with patient. Included topics are cancer screening recommendations, ways to keep healthy (see AVS) including dietary and exercise recommendations, regular eye and dental care, use of seat belts, and avoidance of moderate alcohol use and tobacco use.  Screens are current BMI: discussed patient's BMI and encouraged positive lifestyle modifications to help get to or maintain a target BMI. HM needs and immunizations were addressed and ordered. See below for orders. See HM and immunization section for updates.  Will get flu shot and COVID soon at the pharmacy Routine labs and screening tests ordered including cmp, cbc and lipids where appropriate. Discussed recommendations regarding Vit D and calcium supplementation (see AVS)  Chronic disease f/u and/or acute problem visit: (deemed necessary to be done in addition to the wellness visit): Diabetes: Has been well-controlled, blood sugars running a little high and weight is up.  Will check A1c.  May need to adjust Jardiance up to 25 mg daily from 10.  Continue metformin 1000 twice daily.  Can consider GLP-1. Recheck lipids on rosuvastatin 20 mg nightly.  Check LFTs. Work on diet and exercise and weight  loss Renal stones resolved.  Continue good hydration.  Follow up: 6 mo for recheck dm Commons side effects, risks, benefits, and alternatives for medications and treatment plan prescribed today were discussed, and the patient expressed understanding of the given instructions. Patient is instructed to call or message via MyChart if he/she has any questions or concerns regarding our treatment plan. No barriers to understanding were identified. We discussed Red Flag symptoms  and signs in detail. Patient expressed understanding regarding what to do in case of urgent or emergency type symptoms.  Medication list was reconciled, printed and provided to the patient in AVS. Patient instructions and summary information was reviewed with the patient as documented in the AVS. This note was prepared with assistance of Dragon voice recognition software. Occasional wrong-word or sound-a-like substitutions may have occurred due to the inherent limitations of voice recognition software  Orders Placed This Encounter  Procedures   CBC with Differential/Platelet   Comprehensive metabolic panel   Lipid panel   Hemoglobin A1c   TSH   Microalbumin / creatinine urine ratio   Meds ordered this encounter  Medications   empagliflozin (JARDIANCE) 10 MG TABS tablet    Sig: Take 1 tablet (10 mg total) by mouth daily.    Dispense:  90 tablet    Refill:  3     Patient Active Problem List   Diagnosis Date Noted   Combined hyperlipidemia associated with type 2 diabetes mellitus (HCC) 08/04/2021   Ureteral calculi 08/04/2021   Controlled type 2 diabetes mellitus without complication, without long-term current use of insulin (HCC) 12/10/2017   Obesity (BMI 30.0-34.9) 09/16/2017   Lumbar herniated disc 09/16/2017   Left lumbar radiculopathy 09/16/2017   Osteoarthritis, hand 09/16/2017   Recurrent sinusitis 12/16/2015   Degenerative tear of left medial meniscus 12/10/2017   Heat rash 09/16/2017   Non-seasonal allergic rhinitis 12/16/2015   Health Maintenance  Topic Date Due   Diabetic kidney evaluation - eGFR measurement  02/13/2023   Diabetic kidney evaluation - Urine ACR  02/13/2023   HEMOGLOBIN A1C  02/10/2023   COVID-19 Vaccine (5 - 2023-24 season) 03/06/2023 (Originally 01/31/2023)   INFLUENZA VACCINE  08/30/2023 (Originally 12/31/2022)   DTaP/Tdap/Td (2 - Td or Tdap) 06/02/2023   OPHTHALMOLOGY EXAM  08/14/2023   FOOT EXAM  02/18/2024   Colonoscopy  01/31/2027   Pneumonia  Vaccine 28+ Years old  Completed   Hepatitis C Screening  Completed   Zoster Vaccines- Shingrix  Completed   HPV VACCINES  Aged Out   Immunization History  Administered Date(s) Administered   Fluad Quad(high Dose 65+) 02/12/2022   Influenza Inj Mdck Quad Pf 03/08/2017, 01/27/2018   Influenza, Quadrivalent, Recombinant, Inj, Pf 03/27/2020   Influenza,inj,Quad PF,6+ Mos 02/27/2021   Moderna Covid-19 Vaccine Bivalent Booster 45yrs & up 05/20/2021   PFIZER(Purple Top)SARS-COV-2 Vaccination 08/15/2019, 09/01/2019, 03/20/2020   PNEUMOCOCCAL CONJUGATE-20 05/09/2021   Pneumococcal Polysaccharide-23 03/21/2018   Tdap 06/01/2013   Zoster Recombinant(Shingrix) 12/10/2017, 06/22/2018   We updated and reviewed the patient's past history in detail and it is documented below. Allergies: Patient has No Known Allergies. Past Medical History  has a past medical history of Arthritis, Chicken pox, Degenerative tear of left medial meniscus (12/10/2017), Diet-controlled diabetes mellitus (HCC) (12/10/2017), High triglycerides, Hyperlipidemia, Left lumbar radiculopathy (09/16/2017), Lumbar herniated disc (09/16/2017), Microscopic hematuria (05/09/2021), and Personal history of kidney stones. Past Surgical History Patient  has a past surgical history that includes Tonsilectomy, adenoidectomy, bilateral myringotomy and tubes; Tracheostomy; Kidney stone surgery (06/06/2021); and Extracorporeal  shock wave lithotripsy (Right, 07/24/2022). Social History Patient  reports that he has quit smoking. He has never used smokeless tobacco. He reports that he does not currently use alcohol. He reports that he does not use drugs. Family History family history includes Arthritis in his father and maternal grandmother; Asthma in his mother; Cancer in his maternal grandfather, maternal grandmother, paternal grandfather, and paternal grandmother; Depression in his sister; Diabetes in his mother; Heart attack in his father; Heart  disease in his father and paternal grandfather; Hyperlipidemia in his brother, father, and mother; Kidney disease in his mother; Mental illness in his sister; Miscarriages / Stillbirths in his mother; Stroke in his mother. Review of Systems: Constitutional: negative for fever or malaise Ophthalmic: negative for photophobia, double vision or loss of vision Cardiovascular: negative for chest pain, dyspnea on exertion, or new LE swelling Respiratory: negative for SOB or persistent cough Gastrointestinal: negative for abdominal pain, change in bowel habits or melena Genitourinary: negative for dysuria or gross hematuria Musculoskeletal: negative for new gait disturbance or muscular weakness Integumentary: negative for new or persistent rashes Neurological: negative for TIA or stroke symptoms Psychiatric: negative for SI or delusions Allergic/Immunologic: negative for hives  Patient Care Team    Relationship Specialty Notifications Start End  Willow Ora, MD PCP - General Family Medicine  09/16/17   Christia Reading, MD Consulting Physician Otolaryngology  09/16/17   Ollen Gross, MD Consulting Physician Orthopedic Surgery  09/16/17   Marzella Schlein., MD  Ophthalmology  09/16/17   Dr. Ubaldo Glassing    09/16/17    Comment: Dentist  Crista Elliot, MD Consulting Physician Urology  05/09/21    Objective  Vitals: BP 120/78   Pulse 75   Temp 97.7 F (36.5 C)   Ht 5\' 8"  (1.727 m)   Wt 210 lb (95.3 kg)   SpO2 98%   BMI 31.93 kg/m  General:  Well developed, well nourished, no acute distress  Psych:  Alert and orientedx3,normal mood and affect HEENT:  Normocephalic, atraumatic, non-icteric sclera,  oropharynx is clear without mass or exudate, supple neck without adenopathy, or thyromegaly Cardiovascular:  Normal S1, S2, RRR without gallop, rub or murmur,  Respiratory:  Good breath sounds bilaterally, CTAB with normal respiratory effort Gastrointestinal: normal bowel sounds, soft, non-tender, no  noted masses. No HSM MSK: Joints are without erythema or swelling. Solar lentigos on hands Skin:  Warm, no rashes Neurologic:    Mental status is normal.  Gross motor and sensory exams are normal. Stable gait. No tremor Diabetic Foot Exam: Appearance - no lesions, ulcers or significant calluses Skin - no sigificant pallor or erythema Normal sensation Pulses - +2 distally bilaterally

## 2023-02-18 NOTE — Patient Instructions (Signed)
Please return in 6 months to recheck diabetes.   I will release your lab results to you on your MyChart account with further instructions. You may see the results before I do, but when I review them I will send you a message with my report or have my assistant call you if things need to be discussed. Please reply to my message with any questions. Thank you!   If you have any questions or concerns, please don't hesitate to send me a message via MyChart or call the office at [phone]. Thank you for visiting with [place] today! It's our pleasure caring for you.

## 2023-02-21 MED ORDER — EMPAGLIFLOZIN 25 MG PO TABS: 25.0000 mg | ORAL_TABLET | Freq: Every day | ORAL | 3 refills | Status: DC

## 2023-02-21 NOTE — Progress Notes (Signed)
See mychart note Dear Mr. Austerman, As expected, your diabetic control has worsened. I recommend increasing the dose of your jardiance to 25mg  daily and trying to improve your diet best you can. If this adjustment doesn't help, we can discuss starting a GLP-1 injectable medication for diabetes at your next visit. I recommend rechecking again in 3 months. Please schedule a visit. I have sent the new dose of jardiance in for you. Your other labs look fine.  Sincerely, Dr. Mardelle Matte

## 2023-02-21 NOTE — Addendum Note (Signed)
Addended by: Asencion Partridge on: 02/21/2023 08:36 PM   Modules accepted: Orders

## 2023-05-02 HISTORY — PX: ROTATOR CUFF REPAIR: SHX139

## 2023-05-17 ENCOUNTER — Encounter: Payer: Self-pay | Admitting: Family Medicine

## 2023-05-17 ENCOUNTER — Ambulatory Visit: Payer: Managed Care, Other (non HMO) | Admitting: Family Medicine

## 2023-05-17 VITALS — BP 152/86 | HR 85 | Temp 98.1°F | Ht 68.0 in | Wt 206.2 lb

## 2023-05-17 DIAGNOSIS — M5126 Other intervertebral disc displacement, lumbar region: Secondary | ICD-10-CM

## 2023-05-17 DIAGNOSIS — Z87442 Personal history of urinary calculi: Secondary | ICD-10-CM

## 2023-05-17 DIAGNOSIS — R030 Elevated blood-pressure reading, without diagnosis of hypertension: Secondary | ICD-10-CM | POA: Diagnosis not present

## 2023-05-17 DIAGNOSIS — Z7984 Long term (current) use of oral hypoglycemic drugs: Secondary | ICD-10-CM | POA: Diagnosis not present

## 2023-05-17 DIAGNOSIS — E119 Type 2 diabetes mellitus without complications: Secondary | ICD-10-CM

## 2023-05-17 LAB — URINALYSIS, ROUTINE W REFLEX MICROSCOPIC
Bilirubin Urine: NEGATIVE
Hgb urine dipstick: NEGATIVE
Ketones, ur: NEGATIVE
Leukocytes,Ua: NEGATIVE
Nitrite: NEGATIVE
RBC / HPF: NONE SEEN (ref 0–?)
Specific Gravity, Urine: 1.01 (ref 1.000–1.030)
Total Protein, Urine: NEGATIVE
Urine Glucose: >=1000 — AB
Urobilinogen, UA: 0.2 (ref 0.0–1.0)
WBC, UA: NONE SEEN (ref 0–?)
pH: 6 (ref 5.0–8.0)

## 2023-05-17 LAB — POCT GLYCOSYLATED HEMOGLOBIN (HGB A1C): Hemoglobin A1C: 6.9 % — AB (ref 4.0–5.6)

## 2023-05-17 NOTE — Patient Instructions (Signed)
Please return in 3 months for diabetes follow up   If you have any questions or concerns, please don't hesitate to send me a message via MyChart or call the office at [phone]. Thank you for visiting with [place] today! It's our pleasure caring for you.   VISIT SUMMARY:  During today's visit, we addressed your lower back pain, upcoming rotator cuff repair surgery, and your ongoing management of type 2 diabetes and hypertension. We also discussed general health maintenance, including dietary modifications and physical activity.  YOUR PLAN:  -ROTATOR CUFF TEAR: may all go well today! Let me know if you need anything.   -LOW BACK PAIN: Your lower back pain, which has been present for three days, may be due to musculoskeletal strain or a recurrence of kidney stones. We will obtain a urine sample to rule out kidney stones. In the meantime, manage the pain conservatively with stretching and activity modification.  -TYPE 2 DIABETES MELLITUS: Type 2 diabetes is a condition where the body does not use insulin properly, leading to high blood sugar levels. Your glycemic control has improved, with your HbA1c now at 6.9%. Continue taking Jardiance and metformin, and monitor your blood glucose levels post-surgery. We may consider adding GLP-1 agonists after your surgical recovery to further improve control and assist with weight management.  -HYPERTENSION: Hypertension, or high blood pressure, can increase the risk of heart disease and stroke. Your elevated blood pressure today is likely due to pre-surgical anxiety. We will continue to monitor your blood pressure and reassess control after your surgery.  -GENERAL HEALTH MAINTENANCE: We discussed the importance of dietary modifications to manage your diabetes and prevent kidney stones. Continue with the healthy eating habits you have developed and engage in regular physical activity as tolerated post-surgery.  INSTRUCTIONS:  Please schedule a follow-up  appointment in three months.

## 2023-05-17 NOTE — Progress Notes (Signed)
Subjective  CC:  Chief Complaint  Patient presents with   Diabetes   Back Pain    Back pain has been going on for the past week and would like to have urine checked to make sure nothing is wrong.    HPI: Karl [name] is a 67 y.o. male who presents to the office today for follow up of diabetes and problems listed above in the chief complaint.  Discussed the use of AI scribe software for clinical note transcription with the patient, who gave verbal consent to proceed.  History of Present Illness   The patient, with a past medical history of kidney stones and herniated discs in the L3, L4, and L5 regions, presents with lower back pain that has been present for three days. The pain is worse in the morning but improves throughout the day. The patient reports no pain radiating down the leg, which was a symptom experienced in the past due to the herniated discs. The patient also mentions a concern about the back pain being a symptom of kidney stones, as he had experienced similar pain in the past when diagnosed with kidney stones. However, the patient reports no other symptoms of kidney stones such as blood in the urine or severe pain.  In addition to the back pain, the patient is also scheduled for a rotator cuff repair surgery on the same day due to a hyperextension injury that occurred a few months ago. The patient reports no current pain in the shoulder but decided to proceed with the surgery due to the full coverage provided by worker's compensation and to prevent further deterioration of the shoulder.  The patient reports an improvement in his diabetic numbers since the last visit, with the current reading at 6.9. The patient has been trying to improve his diet and has lost some weight. tolerating the jardiance now at 25 daily and met 1000 bid. diet is fair.      Wt Readings from Last 3 Encounters:  05/17/23 206 lb 3.2 oz (93.5 kg)  02/18/23 210 lb (95.3 kg)  10/19/22 203 lb 3.2 oz (92.2  kg)    BP Readings from Last 3 Encounters:  05/17/23 (!) 152/86  02/18/23 120/78  10/19/22 (!) 140/84    Assessment  1. Controlled type 2 diabetes mellitus without complication, without long-term current use of insulin (HCC)   2. Lumbar herniated disc   3. Elevated blood pressure reading without diagnosis of hypertension   4. History of kidney stones      Plan  Assessment and Plan    Rotator Cuff Tear Confirmed rotator cuff tear with a 7.9 cm tendon separation. MRI indicated full tear. Surgery scheduled today. Discussed procedure, including bone roughening and tendon reattachment. Recovery expected in 4-6 months. Discussed surgical risks and need for post-op physical therapy. Opted for surgery due to workman's compensation coverage and to prevent further deterioration. - Proceed with scheduled rotator cuff repair surgery - Post-operative care including pain management and physical therapy - Monitor for complications and follow up with the surgeon as scheduled  Low Back Pain Intermittent low back pain for three days, worse in the morning. History of herniated discs at L3, L4, and L5. No current radicular symptoms. Differential includes musculoskeletal strain versus recurrence of kidney stones. No hematuria or dysuria. - Obtain urine sample for analysis to rule out kidney stones - Manage conservatively with stretching and activity modification  Type 2 Diabetes Mellitus Improved glycemic control with HbA1c at 6.9%. Currently  on Jardiance and metformin. Discussed potential addition of GLP-1 agonists post-surgery for further control and weight management. Improved diet contributing to better control. - Continue Jardiance and metformin - Monitor blood glucose levels post-surgery - Consider GLP-1 agonists after surgical recovery  elevated bp w/o dx of HTN:  Elevated blood pressure likely due to pre-surgical anxiety. No changes to current management. - Monitor blood pressure - Reassess  blood pressure control post-surgery  General Health Maintenance Discussed dietary modifications to manage diabetes and prevent kidney stones. Encouraged continuation of healthy eating habits developed at home. - Continue dietary modifications - Encourage regular physical activity as tolerated post-surgery  Follow-up - Schedule follow-up appointment in three months.      Orders Placed This Encounter  Procedures   Urinalysis, Routine w reflex microscopic   POCT HgB A1C   No orders of the defined types were placed in this encounter.     Immunization History  Administered Date(s) Administered   Fluad Quad(high Dose 65+) 02/12/2022   Influenza Inj Mdck Quad Pf 03/08/2017, 01/27/2018   Influenza, Quadrivalent, Recombinant, Inj, Pf 03/27/2020   Influenza,inj,Quad PF,6+ Mos 02/27/2021   Moderna Covid-19 Vaccine Bivalent Booster 67yrs & up 05/20/2021   PFIZER(Purple Top)SARS-COV-2 Vaccination 08/15/2019, 09/01/2019, 03/20/2020   PNEUMOCOCCAL CONJUGATE-20 05/09/2021   Pneumococcal Polysaccharide-23 03/21/2018   Tdap 06/01/2013   Zoster Recombinant(Shingrix) 12/10/2017, 06/22/2018    Diabetes Related Lab Review: Lab Results  Component Value Date   HGBA1C 6.9 (A) 05/17/2023   HGBA1C 7.6 (H) 02/18/2023   HGBA1C 6.8 (A) 08/10/2022    Lab Results  Component Value Date   MICROALBUR <0.7 02/18/2023   Lab Results  Component Value Date   CREATININE 1.19 02/18/2023   BUN 19 02/18/2023   NA 140 02/18/2023   K 4.4 02/18/2023   CL 102 02/18/2023   CO2 31 02/18/2023   Lab Results  Component Value Date   CHOL 122 02/18/2023   CHOL 118 02/12/2022   CHOL 128 05/09/2021   Lab Results  Component Value Date   HDL 47.60 02/18/2023   HDL 44.00 02/12/2022   HDL 48.60 05/09/2021   Lab Results  Component Value Date   LDLCALC 50 02/18/2023   LDLCALC 47 02/12/2022   LDLCALC 43 05/09/2021   Lab Results  Component Value Date   TRIG 122.0 02/18/2023   TRIG 138.0 02/12/2022   TRIG  182.0 (H) 05/09/2021   Lab Results  Component Value Date   CHOLHDL 3 02/18/2023   CHOLHDL 3 02/12/2022   CHOLHDL 3 05/09/2021   No results found for: "LDLDIRECT" The ASCVD Risk score (Arnett DK, et al., 2019) failed to calculate for the following reasons:   The valid total cholesterol range is 130 to 320 mg/dL I have reviewed the PMH, Fam and Soc history. Patient Active Problem List   Diagnosis Date Noted Date Diagnosed   Combined hyperlipidemia associated with type 2 diabetes mellitus (HCC) 08/04/2021     Priority: High   Ureteral calculi 08/04/2021     Priority: High    Work-up for microscopic hematuria: Ureteral calculi with right hydronephrosis: Status post stone removal and stent placement.  Normal follow-up 07/2021.  Dr. Alvester Morin, urology    Controlled type 2 diabetes mellitus without complication, without long-term current use of insulin (HCC) 12/10/2017     Priority: High   Obesity (BMI 30.0-34.9) 09/16/2017     Priority: High   Lumbar herniated disc 09/16/2017     Priority: Medium     L3,4,5 by MRI, NeuroSurgery  Left lumbar radiculopathy 09/16/2017     Priority: Medium     Evaluated by NS and treated with PT, [place].     Osteoarthritis, hand 09/16/2017     Priority: Medium    Recurrent sinusitis 12/16/2015     Priority: Medium    Degenerative tear of left medial meniscus 12/10/2017     Priority: Low    Dr. Despina Hick    Heat rash 09/16/2017     Priority: Low    Recurring;     Non-seasonal allergic rhinitis 12/16/2015     Priority: Low    Social History: Patient  reports that he has quit smoking. He has never used smokeless tobacco. He reports that he does not currently use alcohol. He reports that he does not use drugs.  Review of Systems: Ophthalmic: negative for eye pain, loss of vision or double vision Cardiovascular: negative for chest pain Respiratory: negative for SOB or persistent cough Gastrointestinal: negative for abdominal pain Genitourinary:  negative for dysuria or gross hematuria MSK: negative for foot lesions Neurologic: negative for weakness or gait disturbance  Objective  Vitals: BP (!) 152/86   Pulse 85   Temp 98.1 F (36.7 C)   Ht 5\' 8"  (1.727 m)   Wt 206 lb 3.2 oz (93.5 kg)   SpO2 96%   BMI 31.35 kg/m  General: well appearing, no acute distress  Psych:  Alert and oriented, normal mood and affect  Diabetic education: ongoing education regarding chronic disease management for diabetes was given today. We continue to reinforce the ABC's of diabetic management: A1c (<7 or 8 dependent upon patient), tight blood pressure control, and cholesterol management with goal LDL < 100 minimally. We discuss diet strategies, exercise recommendations, medication options and possible side effects. At each visit, we review recommended immunizations and preventive care recommendations for diabetics and stress that good diabetic control can prevent other problems. See below for this patient's data.   Commons side effects, risks, benefits, and alternatives for medications and treatment plan prescribed today were discussed, and the patient expressed understanding of the given instructions. Patient is instructed to call or message via MyChart if he/she has any questions or concerns regarding our treatment plan. No barriers to understanding were identified. We discussed Red Flag symptoms and signs in detail. Patient expressed understanding regarding what to do in case of urgent or emergency type symptoms.  Medication list was reconciled, printed and provided to the patient in AVS. Patient instructions and summary information was reviewed with the patient as documented in the AVS. This note was prepared with assistance of Dragon voice recognition software. Occasional wrong-word or sound-a-like substitutions may have occurred due to the inherent limitations of voice recognition software

## 2023-05-17 NOTE — Progress Notes (Signed)
See mychart note Dear Mr. Linen, Your urine test results are normal.  Sincerely, Dr. Mardelle Matte

## 2023-06-06 DIAGNOSIS — Z0279 Encounter for issue of other medical certificate: Secondary | ICD-10-CM

## 2023-07-31 ENCOUNTER — Other Ambulatory Visit: Payer: Self-pay | Admitting: Family Medicine

## 2023-08-19 ENCOUNTER — Ambulatory Visit: Payer: Managed Care, Other (non HMO) | Admitting: Family Medicine

## 2023-08-19 ENCOUNTER — Encounter: Payer: Self-pay | Admitting: Family Medicine

## 2023-08-19 VITALS — BP 134/81 | HR 85 | Temp 98.2°F | Ht 68.0 in | Wt 201.6 lb

## 2023-08-19 DIAGNOSIS — E119 Type 2 diabetes mellitus without complications: Secondary | ICD-10-CM

## 2023-08-19 DIAGNOSIS — R030 Elevated blood-pressure reading, without diagnosis of hypertension: Secondary | ICD-10-CM | POA: Diagnosis not present

## 2023-08-19 DIAGNOSIS — E66811 Obesity, class 1: Secondary | ICD-10-CM | POA: Diagnosis not present

## 2023-08-19 DIAGNOSIS — Z7984 Long term (current) use of oral hypoglycemic drugs: Secondary | ICD-10-CM

## 2023-08-19 DIAGNOSIS — E1165 Type 2 diabetes mellitus with hyperglycemia: Secondary | ICD-10-CM | POA: Diagnosis not present

## 2023-08-19 LAB — POCT GLYCOSYLATED HEMOGLOBIN (HGB A1C): Hemoglobin A1C: 8.1 % — AB (ref 4.0–5.6)

## 2023-08-19 LAB — MICROALBUMIN / CREATININE URINE RATIO
Creatinine,U: 56.2 mg/dL
Microalb Creat Ratio: UNDETERMINED mg/g (ref 0.0–30.0)
Microalb, Ur: <0.7 mg/dL

## 2023-08-19 NOTE — Progress Notes (Signed)
 Subjective  CC:  Chief Complaint  Patient presents with   Diabetes    HPI: Karl [name] is a 68 y.o. male who presents to the office today for follow up of diabetes and problems listed above in the chief complaint.  Diabetes follow up: His diabetic control is reported as Worse.  Patient had rotator cuff repair in his left shoulder done in December.  He is recovering well.  Still out of work and attending PT.  Had recent orthopedic follow-up.  He has noted that fasting sugars are now becoming elevated as high as 170.  He was 151 this morning.  Diet is mildly changed since he is no longer traveling with work.  Eating more red meat, less fish and more starches in the morning.  Activity level is much less as well since recovery.  He denies exertional CP or SOB or symptomatic hypoglycemia. He denies foot sores or paresthesias.  He is maintained on metformin 1000 twice daily and Farxiga 25 daily.  His eye exam is scheduled next month.  No retinopathy.  No neuropathy.  He is due for nephropathy screening.  He is not on an ACE inhibitor and he is normotensive. Blood pressures at recent orthopedic visits have been normal.  He remains mildly elevated today but much better than last time when he was in pain. Weight: He has decreased his weight with diet and exercise.  Wt Readings from Last 3 Encounters:  08/19/23 201 lb 9.6 oz (91.4 kg)  05/17/23 206 lb 3.2 oz (93.5 kg)  02/18/23 210 lb (95.3 kg)    BP Readings from Last 3 Encounters:  08/19/23 134/81  05/17/23 (!) 152/86  02/18/23 120/78    Assessment  1. Uncontrolled type 2 diabetes mellitus with hyperglycemia (HCC)   2. Elevated blood pressure reading without diagnosis of hypertension   3. Obesity (BMI 30.0-34.9)   4. Diabetes mellitus treated with oral medication (HCC)      Plan  Diabetes is currently poorly controlled.  We discussed dietary changes, activity level changes and stress of surgery as all reasons contributing to  worsening control.  He will start a walking program, exercise and work harder on decreasing starches in his diet.  He will continue to monitor sugars at home, check fastings and 2-hour postprandials.  If fastings are not returning back to normal, we will institute Ozempic.  We discussed advantages and appropriate use of GLP-1's. Blood pressure is improving.  No history of hypertension.  Will continue to follow. Check urine nephropathy screen today.  Continue Jardiance.  Follow up: 3 months to recheck blood pressure weight and diabetes Orders Placed This Encounter  Procedures   Microalbumin / creatinine urine ratio   POCT HgB A1C   No orders of the defined types were placed in this encounter.     Immunization History  Administered Date(s) Administered   Fluad Quad(high Dose 65+) 02/12/2022   Influenza Inj Mdck Quad Pf 03/08/2017, 01/27/2018   Influenza, Quadrivalent, Recombinant, Inj, Pf 03/27/2020   Influenza,inj,Quad PF,6+ Mos 02/27/2021   Moderna Covid-19 Vaccine Bivalent Booster 67yrs & up 05/20/2021   PFIZER(Purple Top)SARS-COV-2 Vaccination 08/15/2019, 09/01/2019, 03/20/2020   PNEUMOCOCCAL CONJUGATE-20 05/09/2021   Pneumococcal Polysaccharide-23 03/21/2018   Tdap 06/01/2013   Zoster Recombinant(Shingrix) 12/10/2017, 06/22/2018    Diabetes Related Lab Review: Lab Results  Component Value Date   HGBA1C 8.1 (A) 08/19/2023   HGBA1C 6.9 (A) 05/17/2023   HGBA1C 7.6 (H) 02/18/2023    Lab Results  Component Value  Date   MICROALBUR <0.7 02/18/2023   Lab Results  Component Value Date   CREATININE 1.19 02/18/2023   BUN 19 02/18/2023   NA 140 02/18/2023   K 4.4 02/18/2023   CL 102 02/18/2023   CO2 31 02/18/2023   Lab Results  Component Value Date   CHOL 122 02/18/2023   CHOL 118 02/12/2022   CHOL 128 05/09/2021   Lab Results  Component Value Date   HDL 47.60 02/18/2023   HDL 44.00 02/12/2022   HDL 48.60 05/09/2021   Lab Results  Component Value Date   LDLCALC 50  02/18/2023   LDLCALC 47 02/12/2022   LDLCALC 43 05/09/2021   Lab Results  Component Value Date   TRIG 122.0 02/18/2023   TRIG 138.0 02/12/2022   TRIG 182.0 (H) 05/09/2021   Lab Results  Component Value Date   CHOLHDL 3 02/18/2023   CHOLHDL 3 02/12/2022   CHOLHDL 3 05/09/2021   No results found for: "LDLDIRECT" The ASCVD Risk score (Arnett DK, et al., 2019) failed to calculate for the following reasons:   The valid total cholesterol range is 130 to 320 mg/dL I have reviewed the PMH, Fam and Soc history. Patient Active Problem List   Diagnosis Date Noted Date Diagnosed   Combined hyperlipidemia associated with type 2 diabetes mellitus (HCC) 08/04/2021     Priority: High   Ureteral calculi 08/04/2021     Priority: High    Work-up for microscopic hematuria: Ureteral calculi with right hydronephrosis: Status post stone removal and stent placement.  Normal follow-up 07/2021.  Dr. Alvester Morin, urology    Controlled type 2 diabetes mellitus without complication, without long-term current use of insulin (HCC) 12/10/2017     Priority: High   Obesity (BMI 30.0-34.9) 09/16/2017     Priority: High   Lumbar herniated disc 09/16/2017     Priority: Medium     L3,4,5 by MRI, NeuroSurgery    Left lumbar radiculopathy 09/16/2017     Priority: Medium     Evaluated by NS and treated with PT, [facility].     Osteoarthritis, hand 09/16/2017     Priority: Medium    Recurrent sinusitis 12/16/2015     Priority: Medium    Degenerative tear of left medial meniscus 12/10/2017     Priority: Low    Dr. Despina Hick    Heat rash 09/16/2017     Priority: Low    Recurring;     Non-seasonal allergic rhinitis 12/16/2015     Priority: Low    Social History: Patient  reports that he has quit smoking. He has never used smokeless tobacco. He reports that he does not currently use alcohol. He reports that he does not use drugs.  Review of Systems: Ophthalmic: negative for eye pain, loss of vision or double  vision Cardiovascular: negative for chest pain Respiratory: negative for SOB or persistent cough Gastrointestinal: negative for abdominal pain Genitourinary: negative for dysuria or gross hematuria MSK: negative for foot lesions Neurologic: negative for weakness or gait disturbance  Objective  Vitals: BP 134/81   Pulse 85   Temp 98.2 F (36.8 C)   Ht 5\' 8"  (1.727 m)   Wt 201 lb 9.6 oz (91.4 kg)   SpO2 98%   BMI 30.65 kg/m  General: well appearing, no acute distress  Psych:  Alert and oriented, normal mood and affect  Diabetic education: ongoing education regarding chronic disease management for diabetes was given today. We continue to reinforce the ABC's of diabetic management:  A1c (<7 or 8 dependent upon patient), tight blood pressure control, and cholesterol management with goal LDL < 100 minimally. We discuss diet strategies, exercise recommendations, medication options and possible side effects. At each visit, we review recommended immunizations and preventive care recommendations for diabetics and stress that good diabetic control can prevent other problems. See below for this patient's data.   Commons side effects, risks, benefits, and alternatives for medications and treatment plan prescribed today were discussed, and the patient expressed understanding of the given instructions. Patient is instructed to call or message via MyChart if he/she has any questions or concerns regarding our treatment plan. No barriers to understanding were identified. We discussed Red Flag symptoms and signs in detail. Patient expressed understanding regarding what to do in case of urgent or emergency type symptoms.  Medication list was reconciled, printed and provided to the patient in AVS. Patient instructions and summary information was reviewed with the patient as documented in the AVS. This note was prepared with assistance of Dragon voice recognition software. Occasional wrong-word or sound-a-like  substitutions may have occurred due to the inherent limitations of voice recognition software

## 2023-10-13 ENCOUNTER — Ambulatory Visit: Payer: Self-pay

## 2023-10-13 NOTE — Telephone Encounter (Signed)
  Chief Complaint: left jaw and neck pain Symptoms: pain with swallowing and chewing Frequency: x 5 hours today Pertinent Negatives: Patient denies fever, redness, neuro deficits Disposition: [] ED /[] Urgent Care (no appt availability in office) / [] Appointment(In office/virtual)/ []  [place] [facility]/ [] Home Care/ [] Refused Recommended Disposition /[] [place] [facility]/ [x]  Follow-up with PCP Additional Notes: left neck jaw pain x 5 hours today, no precipitating event. Pain with chewing and swallowing. NSAIDs, tylenol for pain, appointment scheduled for tomorrow, 5/15   Copied from CRM #[id number]. Topic: Clinical - Red Word Triage >> Oct 13, 2023  3:26 PM Albertha Alosa wrote: [facility] that prompted transfer to Nurse Triage: Patient called in stating his jaw is swollen, and it hurts, hurts when he tries to eat Reason for Disposition  [1] MODERATE pain (e.g., interferes with normal activities) AND [2] constant AND [3] present > 24 hours  Answer Assessment - Initial Assessment Questions 1. ONSET: "When did the pain start?" (e.g., minutes, hours, days)     5 hours ago 2. ONSET: "Does the pain come and go, or has it been constant since it started?" (e.g., constant, intermittent, fleeting)     Increased pain with chewing and swallowin 3. SEVERITY: "How bad is the pain?"   (Scale 1-10; mild, moderate or severe)   - MILD (1-3): doesn't interfere with normal activities    - MODERATE (4-7): interferes with normal activities or awakens from sleep    - SEVERE (8-10): excruciating pain, unable to do any normal activities      7/10 with activity 4. LOCATION: "Where does it hurt?"      Left jaw and neck 5. RASH: "Is there any redness, rash, or swelling of the face?"     none 6. FEVER: "Do you have a fever?" If Yes, ask: "What is it, how was it measured, and when did it start?"      no 7. OTHER SYMPTOMS: "Do you have any other symptoms?" (e.g., fever, toothache, nasal discharge, nasal congestion,  clicking sensation in jaw joint)     Tender when he palpates jaw  Protocols used: Face Pain-A-AH

## 2023-10-14 ENCOUNTER — Encounter: Payer: Self-pay | Admitting: Family Medicine

## 2023-10-14 ENCOUNTER — Ambulatory Visit: Admitting: Family Medicine

## 2023-10-14 VITALS — BP 132/82 | HR 79 | Temp 98.0°F | Ht 68.0 in | Wt 202.6 lb

## 2023-10-14 DIAGNOSIS — M542 Cervicalgia: Secondary | ICD-10-CM | POA: Diagnosis not present

## 2023-10-14 DIAGNOSIS — M79672 Pain in left foot: Secondary | ICD-10-CM | POA: Diagnosis not present

## 2023-10-14 DIAGNOSIS — Z7984 Long term (current) use of oral hypoglycemic drugs: Secondary | ICD-10-CM

## 2023-10-14 DIAGNOSIS — J3089 Other allergic rhinitis: Secondary | ICD-10-CM | POA: Diagnosis not present

## 2023-10-14 DIAGNOSIS — E119 Type 2 diabetes mellitus without complications: Secondary | ICD-10-CM | POA: Diagnosis not present

## 2023-10-14 NOTE — Progress Notes (Signed)
   Karl [name] is a 68 y.o. male who presents today for an office visit.  Assessment/Plan:  Left Neck Pain  Overall very reassuring exam.  Symptoms have improved quite a bit since yesterday with Aleve.  May have had temporary blocked salivary gland.  No evidence of TMJ pain on today's exam and no signs of infection.Given that symptoms are improving do not think we need to do any further workup at this point.  Recommended patient use warm compress to the area.  He can also continue with Aleve.  He can also try sugar-free hard candies to promote saliva flow.  No evidence of lymphadenopathy on today's exam however we can check ultrasound if symptoms persist for a few more weeks.    Rhinitis  Currently on Allegra and Singulair .  May be contributing to above issues with dry mouth and salivary gland blockage.  We discussed conservative measures as above.  T2DM  He is working with his PCP on this.  Last A1c was uncontrolled.  We briefly discussed lifestyle interventions.  Hopefully will have some improvement with A1c with treatment for his orthopedic issues.  He is currently on Jardiance  25 mg daily and metformin  1000 mg twice daily.  Left foot pain Corn appreciated on exam today.  Also loss of transverse arch.  Recommended corn and callus cushioning.  Also discussed good footwear and metatarsal support.      Subjective:  HPI:  See a/P for status of chronic conditions.  Patient here today with neck and jaw pain.  Started yesterday.He was concerned about swollen and tender lymph nodes. No obvious triggering events. He did noticed that the pain was much worse with eating and swallowing. Took aleve with some improvement. HE did have a sore throat a couple of weeks ago. No fevers or chills. No rhinorrhea.  Pain is better today compared to yesterday.  He has not noticed much swelling.  He has not had any ear pain.  Did have a dry mouth a couple of weeks ago.  No recent medication changes.  He has also  been having more pain in his left foot for the last several weeks to months.  Located on the outer aspect of his left foot.  He has noticed increased callus formation as well.  He has been trying different footwear which does seem to improve when wearing hiking shoes.       Objective:  Physical Exam: BP 132/82   Pulse 79   Temp 98 F (36.7 C) (Temporal)   Ht 5\' 8"  (1.727 m)   Wt 202 lb 9.6 oz (91.9 kg)   SpO2 98%   BMI 30.81 kg/m   Gen: No acute distress, resting comfortably HEENT: TMs clear bilaterally.  OP clear.  Nasal mucosa clear.  TMJs palpated bilaterally without pain.  Left neck palpated without any pain.  No obvious masses or lesions.  Full range of motion throughout neck. CV: Regular rate and rhythm with no murmurs appreciated Pulm: Normal work of breathing, clear to auscultation bilaterally with no crackles, wheezes, or rhonchi MUSCULOSKELETAL: - Left Foot: Loss of transverse arch noted.  Hyperkeratotic lesion on lateral aspect of fifth MTP joint. Neuro: Grossly normal, moves all extremities Psych: Normal affect and thought content      Karl Jiron M. Daneil Dunker, MD 10/14/2023 9:11 AM

## 2023-10-14 NOTE — Patient Instructions (Addendum)
 It was very nice to see you today!  You may have had a mild blockage in one of your salivary glands.  I am glad that your symptoms are improving.  Use warm compresses to the area.  You can continue with Aleve.  You can also try using sugar-free hard candy to promote saliva flow.  Let us  know if symptoms are not improving in the next few days.  Please make sure that you are getting good support with your foot wear.  You can try using metatarsal pads as well.  You can use a corn and callus cushion on your foot.  Please let us  know if not improving.  No medication changes today.  Return if symptoms worsen or fail to improve.   Take care, Dr Daneil Dunker  PLEASE NOTE:  If you had any lab tests, please let us  know if you have not heard back within a few days. You may see your results on mychart before we have a chance to review them but we will give you a call once they are reviewed by us .   If we ordered any referrals today, please let us  know if you have not heard from their office within the next week.   If you had any urgent prescriptions sent in today, please check with the pharmacy within an hour of our visit to make sure the prescription was transmitted appropriately.   Please try these tips to maintain a healthy lifestyle:  Eat at least 3 REAL meals and 1-2 snacks per day.  Aim for no more than 5 hours between eating.  If you eat breakfast, please do so within one hour of getting up.   Each meal should contain half fruits/vegetables, one quarter protein, and one quarter carbs (no bigger than a computer mouse)  Cut down on sweet beverages. This includes juice, soda, and sweet tea.   Drink at least 1 glass of water with each meal and aim for at least 8 glasses per day  Exercise at least 150 minutes every week.

## 2023-11-19 ENCOUNTER — Encounter: Payer: Self-pay | Admitting: Family Medicine

## 2023-11-19 ENCOUNTER — Ambulatory Visit: Admitting: Family Medicine

## 2023-11-19 ENCOUNTER — Ambulatory Visit: Admitting: Physician Assistant

## 2023-11-19 VITALS — BP 131/69 | HR 86 | Temp 97.7°F | Ht 68.0 in | Wt 196.0 lb

## 2023-11-19 DIAGNOSIS — E1165 Type 2 diabetes mellitus with hyperglycemia: Secondary | ICD-10-CM | POA: Diagnosis not present

## 2023-11-19 DIAGNOSIS — R5383 Other fatigue: Secondary | ICD-10-CM

## 2023-11-19 DIAGNOSIS — L304 Erythema intertrigo: Secondary | ICD-10-CM

## 2023-11-19 DIAGNOSIS — Z7985 Long-term (current) use of injectable non-insulin antidiabetic drugs: Secondary | ICD-10-CM | POA: Diagnosis not present

## 2023-11-19 DIAGNOSIS — R6882 Decreased libido: Secondary | ICD-10-CM

## 2023-11-19 LAB — CBC WITH DIFFERENTIAL/PLATELET
Basophils Absolute: 0.1 10*3/uL (ref 0.0–0.1)
Basophils Relative: 0.9 % (ref 0.0–3.0)
Eosinophils Absolute: 0.2 10*3/uL (ref 0.0–0.7)
Eosinophils Relative: 3.2 % (ref 0.0–5.0)
HCT: 48.7 % (ref 39.0–52.0)
Hemoglobin: 16.3 g/dL (ref 13.0–17.0)
Lymphocytes Relative: 24 % (ref 12.0–46.0)
Lymphs Abs: 1.7 10*3/uL (ref 0.7–4.0)
MCHC: 33.5 g/dL (ref 30.0–36.0)
MCV: 88.3 fl (ref 78.0–100.0)
Monocytes Absolute: 0.5 10*3/uL (ref 0.1–1.0)
Monocytes Relative: 6.6 % (ref 3.0–12.0)
Neutro Abs: 4.6 10*3/uL (ref 1.4–7.7)
Neutrophils Relative %: 65.3 % (ref 43.0–77.0)
Platelets: 239 10*3/uL (ref 150.0–400.0)
RBC: 5.52 Mil/uL (ref 4.22–5.81)
RDW: 13.6 % (ref 11.5–15.5)
WBC: 7 10*3/uL (ref 4.0–10.5)

## 2023-11-19 LAB — POCT GLYCOSYLATED HEMOGLOBIN (HGB A1C): Hemoglobin A1C: 7.5 % — AB (ref 4.0–5.6)

## 2023-11-19 LAB — TSH: TSH: 1.12 u[IU]/mL (ref 0.35–5.50)

## 2023-11-19 MED ORDER — KETOCONAZOLE 2 % EX CREA: 1.0000 | TOPICAL_CREAM | Freq: Two times a day (BID) | CUTANEOUS | 2 refills | Status: AC | PRN

## 2023-11-19 MED ORDER — TIRZEPATIDE 2.5 MG/0.5ML ~~LOC~~ SOAJ: 2.5000 mg | SUBCUTANEOUS | 2 refills | Status: DC

## 2023-11-19 NOTE — Progress Notes (Signed)
 Subjective  CC:  Chief Complaint  Patient presents with   Hypertension   Diabetes   Obesity    HPI: Karl [name] is a 68 y.o. male who presents to the office today for follow up of diabetes and problems listed above in the chief complaint.  Discussed the use of AI scribe software for clinical note transcription with the patient, who gave verbal consent to proceed.  History of Present Illness Karl [name] is a 68 year old male with type 2 diabetes and hyperlipidemia who presents for diabetic follow-up.  His last A1c was 8.3, and today it is 7.5. He has been working on his diet, cutting back on carbohydrates, and has lost about five pounds. His fasting blood glucose this morning was 102, and his blood sugar has not exceeded 117 in the past week. He does not routinely check his blood sugar two hours postprandial. He is currently taking Jardiance  and Metformin . 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  He experiences persistent itching in the groin area, which he describes as not a rash but bothersome. Ketoconazole  cream has been effective in managing this symptom, but he has run out of the medication.  He has concerns about low energy levels and potential low testosterone, noting that he feels lethargic and sometimes needs to nap despite getting a good night's sleep. He also mentions a decrease in libido.    Wt Readings from Last 3 Encounters:  11/19/23 196 lb (88.9 kg)  10/14/23 202 lb 9.6 oz (91.9 kg)  08/19/23 201 lb 9.6 oz (91.4 kg)    BP Readings from Last 3 Encounters:  11/19/23 131/69  10/14/23 132/82  08/19/23 134/81    Assessment  1. Uncontrolled type 2 diabetes mellitus with hyperglycemia (HCC)   2. Intertrigo   3. Other fatigue   4. Low libido      Plan  Assessment and Plan Assessment & Plan Type 2 Diabetes Mellitus Diabetes suboptimally  controlled with A1c of 7.5%. Dietary changes have improved control. Mounjaro preferred for better weight loss and fewer side effects. 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 - Start Mounjaro (tirzepatide).2.5mg  weekly - Monitor blood glucose twice daily, fasting and postprandial.discussed goals of fastings and postprandial readings - Provide A1c and glucose logs for [facility] medical examiner. - Follow up in 4-6 weeks for glucose control assessment.  Faituge and low libido Symptoms suggestive of low testosterone. Hyperglycemia may contribute to fatigue. Interested in testosterone evaluation. Discussed risks of replacement therapy. - Order morning testosterone level test. Cbc and tsh - Discuss treatment options and risks if levels are low.  intertrigo Chronic itching in groin managed with ketoconazole  cream. Relief noted but not used continuously. Preventive measures advised. - Prescribe ketoconazole  cream as needed. 2 week course prn - Advise on preventive measures, including keeping area dry and using monistat powder.    Follow up: 6-12 weeks for recheck Orders Placed This Encounter  Procedures   Testosterone,Free and Total   CBC with Differential/Platelet   TSH   POCT HgB A1C   Meds ordered this encounter  Medications   tirzepatide (MOUNJARO) 2.5 MG/0.5ML Pen    Sig: Inject 2.5 mg into the skin once a week.    Dispense:  2 mL    Refill:  2   ketoconazole  (NIZORAL ) 2 % cream    Sig: Apply 1 Application topically 2 (two) times  daily as needed for up to 14 days for irritation.    Dispense:  30 g    Refill:  2      Immunization History  Administered Date(s) Administered   Fluad Quad(high Dose 65+) 02/12/2022   Influenza Inj Mdck Quad Pf 03/08/2017, 01/27/2018   Influenza, Quadrivalent, Recombinant, Inj, Pf 03/27/2020   Influenza,inj,Quad PF,6+ Mos 02/27/2021   Moderna Covid-19 Vaccine Bivalent Booster 23yrs & up 05/20/2021   PFIZER(Purple Top)SARS-COV-2 Vaccination  08/15/2019, 09/01/2019, 03/20/2020   PNEUMOCOCCAL CONJUGATE-20 05/09/2021   Pneumococcal Polysaccharide-23 03/21/2018   Tdap 06/01/2013   Zoster Recombinant(Shingrix ) 12/10/2017, 06/22/2018    Diabetes Related Lab Review: Lab Results  Component Value Date   HGBA1C 7.5 (A) 11/19/2023   HGBA1C 8.1 (A) 08/19/2023   HGBA1C 6.9 (A) 05/17/2023    Lab Results  Component Value Date   MICROALBUR <0.7 08/19/2023   Lab Results  Component Value Date   CREATININE 1.19 02/18/2023   BUN 19 02/18/2023   NA 140 02/18/2023   K 4.4 02/18/2023   CL 102 02/18/2023   CO2 31 02/18/2023   Lab Results  Component Value Date   CHOL 122 02/18/2023   CHOL 118 02/12/2022   CHOL 128 05/09/2021   Lab Results  Component Value Date   HDL 47.60 02/18/2023   HDL 44.00 02/12/2022   HDL 48.60 05/09/2021   Lab Results  Component Value Date   LDLCALC 50 02/18/2023   LDLCALC 47 02/12/2022   LDLCALC 43 05/09/2021   Lab Results  Component Value Date   TRIG 122.0 02/18/2023   TRIG 138.0 02/12/2022   TRIG 182.0 (H) 05/09/2021   Lab Results  Component Value Date   CHOLHDL 3 02/18/2023   CHOLHDL 3 02/12/2022   CHOLHDL 3 05/09/2021   No results found for: LDLDIRECT The ASCVD Risk score (Arnett DK, et al., 2019) failed to calculate for the following reasons:   The valid total cholesterol range is 130 to 320 mg/dL I have reviewed the PMH, Fam and Soc history. Patient Active Problem List   Diagnosis Date Noted   Combined hyperlipidemia associated with type 2 diabetes mellitus (HCC) 08/04/2021    Priority: High   Ureteral calculi 08/04/2021    Priority: High    Work-up for microscopic hematuria: Ureteral calculi with right hydronephrosis: Status post stone removal and stent placement.  Normal follow-up 07/2021.  Dr. Parke Boll, urology    Controlled type 2 diabetes mellitus without complication, without long-term current use of insulin (HCC) 12/10/2017    Priority: High    Jardiance  25, met 1000 bid,     Obesity (BMI 30.0-34.9) 09/16/2017    Priority: High   Lumbar herniated disc 09/16/2017    Priority: Medium     L3,4,5 by MRI, NeuroSurgery    Left lumbar radiculopathy 09/16/2017    Priority: Medium     Evaluated by NS and treated with PT, [facility].     Osteoarthritis, hand 09/16/2017    Priority: Medium    Recurrent sinusitis 12/16/2015    Priority: Medium    Degenerative tear of left medial meniscus 12/10/2017    Priority: Low    Dr. Rossie Coon    Heat rash 09/16/2017    Priority: Low    Recurring;     Non-seasonal allergic rhinitis 12/16/2015    Priority: Low    Social History: Patient  reports that he has quit smoking. He has never used smokeless tobacco. He reports that he does not currently use alcohol. He reports that  he does not use drugs.  Review of Systems: Ophthalmic: negative for eye pain, loss of vision or double vision Cardiovascular: negative for chest pain Respiratory: negative for SOB or persistent cough Gastrointestinal: negative for abdominal pain Genitourinary: negative for dysuria or gross hematuria MSK: negative for foot lesions Neurologic: negative for weakness or gait disturbance  Objective  Vitals: BP 131/69   Pulse 86   Temp 97.7 F (36.5 C)   Ht 5' 8 (1.727 m)   Wt 196 lb (88.9 kg)   SpO2 99%   BMI 29.80 kg/m  General: well appearing, no acute distress  Psych:  Alert and oriented, normal mood and affect   Diabetic education: ongoing education regarding chronic disease management for diabetes was given today. We continue to reinforce the ABC's of diabetic management: A1c (<7 or 8 dependent upon patient), tight blood pressure control, and cholesterol management with goal LDL < 100 minimally. We discuss diet strategies, exercise recommendations, medication options and possible side effects. At each visit, we review recommended immunizations and preventive care recommendations for diabetics and stress that good diabetic control can  prevent other problems. See below for this patient's data. Commons side effects, risks, benefits, and alternatives for medications and treatment plan prescribed today were discussed, and the patient expressed understanding of the given instructions. Patient is instructed to call or message via MyChart if he/she has any questions or concerns regarding our treatment plan. No barriers to understanding were identified. We discussed Red Flag symptoms and signs in detail. Patient expressed understanding regarding what to do in case of urgent or emergency type symptoms.  Medication list was reconciled, printed and provided to the patient in AVS. Patient instructions and summary information was reviewed with the patient as documented in the AVS. This note was prepared with assistance of Dragon voice recognition software. Occasional wrong-word or sound-a-like substitutions may have occurred due to the inherent limitations of voice recognition software

## 2023-11-19 NOTE — Patient Instructions (Addendum)
 Please return in 3 months for diabetes follow up   If you have any questions or concerns, please don't hesitate to send me a message via MyChart or call the office at [phone]. Thank you for visiting with us  today! It's our pleasure caring for you.    VISIT SUMMARY: Today, you came in for a follow-up on your type 2 diabetes and other health concerns. 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  YOUR PLAN: -TYPE 2 DIABETES MELLITUS: Type 2 diabetes is a condition where your body does not use insulin properly, leading to high blood sugar levels. Your A1c has improved to 7.5%, but we aim for better control. We will start you on Mounjaro (tirzepatide) to help with weight loss and blood sugar control. 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 We will reassess your glucose control in 4-6 weeks.  -SUSPECTED LOW TESTOSTERONE: Low testosterone can cause symptoms like fatigue and low libido. We will check your morning testosterone levels to see if they are low. If they are, we will discuss treatment options and the associated risks.  -intertrigo: You have been experiencing itching in your groin area, which has been managed with ketoconazole  cream. We will prescribe more of this cream for you to use as needed. Additionally, keep the area dry and consider using monistat powder to prevent further itching.  INSTRUCTIONS: Please follow up in 4-6 weeks to assess your glucose control. 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 Also, get your morning testosterone levels checked as discussed.                      Contains text generated by [employer].                                  Contains text generated by [employer].

## 2023-11-22 LAB — TESTOSTERONE,FREE AND TOTAL
Testosterone, Free: 1.6 pg/mL — ABNORMAL LOW (ref 6.6–18.1)
Testosterone: 311 ng/dL (ref 264–916)

## 2023-11-23 ENCOUNTER — Ambulatory Visit: Payer: Self-pay | Admitting: Family Medicine

## 2023-11-23 DIAGNOSIS — E349 Endocrine disorder, unspecified: Secondary | ICD-10-CM | POA: Insufficient documentation

## 2023-11-23 NOTE — Progress Notes (Signed)
 See mychart note Dear Mr. Harty, Your testosterone levels are low. We could consider replacing your testosterone to raise your levels. I can order it and then I'd recommend an office visit to discuss risks and benefits and show you how to administer it if you'd like. Or we can discuss at your next visit.  Sincerely, Dr. Jodie

## 2023-11-24 NOTE — Telephone Encounter (Signed)
 Noted

## 2023-11-29 LAB — HM DIABETES EYE EXAM

## 2023-12-27 ENCOUNTER — Encounter: Payer: Self-pay | Admitting: Family Medicine

## 2023-12-27 ENCOUNTER — Ambulatory Visit (INDEPENDENT_AMBULATORY_CARE_PROVIDER_SITE_OTHER): Admitting: Family Medicine

## 2023-12-27 VITALS — BP 118/72 | HR 80 | Temp 97.8°F | Ht 68.0 in | Wt 199.4 lb

## 2023-12-27 DIAGNOSIS — E349 Endocrine disorder, unspecified: Secondary | ICD-10-CM | POA: Diagnosis not present

## 2023-12-27 DIAGNOSIS — Z7985 Long-term (current) use of injectable non-insulin antidiabetic drugs: Secondary | ICD-10-CM

## 2023-12-27 DIAGNOSIS — Z7984 Long term (current) use of oral hypoglycemic drugs: Secondary | ICD-10-CM

## 2023-12-27 DIAGNOSIS — E119 Type 2 diabetes mellitus without complications: Secondary | ICD-10-CM

## 2023-12-27 DIAGNOSIS — E1165 Type 2 diabetes mellitus with hyperglycemia: Secondary | ICD-10-CM

## 2023-12-27 MED ORDER — TIRZEPATIDE 5 MG/0.5ML ~~LOC~~ SOAJ: 5.0000 mg | SUBCUTANEOUS | 5 refills | Status: DC

## 2023-12-27 NOTE — Patient Instructions (Addendum)
 Please return in Please follow up as scheduled for your next visit with me: 02/21/2024   If you have any questions or concerns, please don't hesitate to send me a message via MyChart or call the office at [phone]. Thank you for visiting with us  today! It's our pleasure caring for you.   If you have any questions or concerns, please don't hesitate to send me a message via MyChart or call the office at [phone]. Thank you for visiting with us  today! It's our pleasure caring for you.

## 2023-12-27 NOTE — Progress Notes (Signed)
 Subjective  CC:  Chief Complaint  Patient presents with   Follow-up   Diabetes    HPI: Karl [name] is a 68 y.o. male who presents to the office today for follow up of diabetes and problems listed above in the chief complaint.  Discussed the use of AI scribe software for clinical note transcription with the patient, who gave verbal consent to proceed.  History of Present Illness Karl [name] is a 68 year old male with diabetes who presents for follow-up diabetes, hypertension and hypotestosteronism   We added Mounjaro  2.5 mg about 6 weeks ago due to an elevated A1c.  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  He has been taking his diabetes medication without any side effects. His weight has remained stable, and he has been eating smaller portions.  He has not had any weight loss at this time.  He experienced discomfort after eating a small amount of food at a family gathering, which he attributes to the medication's effect on his appetite and gastric emptying. He monitors his blood glucose levels, noting morning readings generally in the low 100s, with some as low as 85. Evening readings vary, with some as low as 100 and others higher, such as 156 after consuming red velvet cake. Certain foods, like cake and milkshakes, cause higher readings.  Blood sugar control is much better on Mounjaro .  He continues on metformin  twice daily and Jardiance   We discussed his low testosterone  levels.  Free testosterone  was 1.6.  Total testosterone  was in the high 200s.  He says his energy levels wax and wane.  He wonders if treating his low testosterone  would improve this.  We discussed other possibilities, he has been tested for sleep apnea and was negative last year.  Other blood work has been stable  Of note, his younger brother had a large heart attack large heart attack, fortunately did not suffer any damage to his heart.  But was recently diagnosed with gigantism which they believe  is hereditary.  Patient will get more information to see if there is anything we need to look into.  His father and grandfather also had heart disease but it was not premature.  Both were heavy smokers.    Wt Readings from Last 3 Encounters:  12/27/23 199 lb 6.4 oz (90.4 kg)  11/19/23 196 lb (88.9 kg)  10/14/23 202 lb 9.6 oz (91.9 kg)    BP Readings from Last 3 Encounters:  12/27/23 118/72  11/19/23 131/69  10/14/23 132/82    Assessment  1. Uncontrolled type 2 diabetes mellitus with hyperglycemia (HCC)   2. Hypotestosteronism   3. Controlled type 2 diabetes mellitus without complication, without long-term current use of insulin (HCC)      Plan  Assessment and Plan Assessment & Plan Type 2 Diabetes Mellitus Blood glucose monitoring shows better control.  Improved with the addition of Mounjaro . - Continue Mounjaro , increase dose to 5 mg after current month of 2.5 mg.  Continue metformin  twice daily and Jardiance  - Follow-up in six weeks to recheck A1c. - Maintain dietary modifications. - Expecting some weight loss at follow-up visit as well.  Low Testosterone  Low testosterone  possibly contributing to fatigue and sleep issues. Topical testosterone  considered safer but less effective than injectable forms. He prefers topical due to lifestyle. - Discuss testosterone  management with urologist in August. - Consider starting topical testosterone  if recommended. - Monitor testosterone  levels and symptoms.  Hypertension remains well-controlled.  He is eating better  now.  Continue to monitor.  General Health Maintenance Regular physical activity aids in overall health and diabetes management. Blood pressure well-controlled. - Continue regular physical activity, including cardiovascular and strength training exercises.    Follow up: September for complete physical No orders of the defined types were placed in this encounter.  Meds ordered this encounter  Medications    tirzepatide  (MOUNJARO ) 5 MG/0.5ML Pen    Sig: Inject 5 mg into the skin once a week.    Dispense:  2 mL    Refill:  5    Please keep on file for next refill due      Immunization History  Administered Date(s) Administered   Fluad Quad(high Dose 65+) 02/12/2022   Influenza Inj Mdck Quad Pf 03/08/2017, 01/27/2018   Influenza, Quadrivalent, Recombinant, Inj, Pf 03/27/2020   Influenza,inj,Quad PF,6+ Mos 02/27/2021   Moderna Covid-19 Vaccine Bivalent Booster 22yrs & up 05/20/2021   PFIZER(Purple Top)SARS-COV-2 Vaccination 08/15/2019, 09/01/2019, 03/20/2020   PNEUMOCOCCAL CONJUGATE-20 05/09/2021   Pneumococcal Polysaccharide-23 03/21/2018   Tdap 06/01/2013   Zoster Recombinant(Shingrix ) 12/10/2017, 06/22/2018    Diabetes Related Lab Review: Lab Results  Component Value Date   HGBA1C 7.5 (A) 11/19/2023   HGBA1C 8.1 (A) 08/19/2023   HGBA1C 6.9 (A) 05/17/2023    Lab Results  Component Value Date   MICROALBUR <0.7 08/19/2023   Lab Results  Component Value Date   CREATININE 1.19 02/18/2023   BUN 19 02/18/2023   NA 140 02/18/2023   K 4.4 02/18/2023   CL 102 02/18/2023   CO2 31 02/18/2023   Lab Results  Component Value Date   CHOL 122 02/18/2023   CHOL 118 02/12/2022   CHOL 128 05/09/2021   Lab Results  Component Value Date   HDL 47.60 02/18/2023   HDL 44.00 02/12/2022   HDL 48.60 05/09/2021   Lab Results  Component Value Date   LDLCALC 50 02/18/2023   LDLCALC 47 02/12/2022   LDLCALC 43 05/09/2021   Lab Results  Component Value Date   TRIG 122.0 02/18/2023   TRIG 138.0 02/12/2022   TRIG 182.0 (H) 05/09/2021   Lab Results  Component Value Date   CHOLHDL 3 02/18/2023   CHOLHDL 3 02/12/2022   CHOLHDL 3 05/09/2021   No results found for: LDLDIRECT The ASCVD Risk score (Arnett DK, et al., 2019) failed to calculate for the following reasons:   The valid total cholesterol range is 130 to 320 mg/dL I have reviewed the PMH, Fam and Soc history. Patient Active  Problem List   Diagnosis Date Noted   Hypotestosteronism 11/23/2023    Priority: High    Totoal 300s, low Free T4, 10/2023    Combined hyperlipidemia associated with type 2 diabetes mellitus (HCC) 08/04/2021    Priority: High   Ureteral calculi 08/04/2021    Priority: High    Work-up for microscopic hematuria: Ureteral calculi with right hydronephrosis: Status post stone removal and stent placement.  Normal follow-up 07/2021.  Dr. Carolee, urology    Controlled type 2 diabetes mellitus without complication, without long-term current use of insulin (HCC) 12/10/2017    Priority: High    Jardiance  25, met 1000 bid, added mounjaro  10/2023    Obesity (BMI 30.0-34.9) 09/16/2017    Priority: High   Lumbar herniated disc 09/16/2017    Priority: Medium     L3,4,5 by MRI, NeuroSurgery    Left lumbar radiculopathy 09/16/2017    Priority: Medium     Evaluated by NS and treated with PT, [facility].     Osteoarthritis, hand 09/16/2017    Priority: Medium    Recurrent sinusitis 12/16/2015    Priority: Medium    Degenerative tear of left medial meniscus 12/10/2017    Priority: Low    Dr. Hiram    Heat rash 09/16/2017    Priority: Low    Recurring;     Non-seasonal allergic rhinitis 12/16/2015    Priority: Low    Social History: Patient  reports that he has quit smoking. He has never used smokeless tobacco. He reports that he does not currently use alcohol. He reports that he does not use drugs.  Review of Systems: Ophthalmic: negative for eye pain, loss of vision or double vision Cardiovascular: negative for chest pain Respiratory: negative for SOB or persistent cough Gastrointestinal: negative for abdominal pain Genitourinary: negative for dysuria or gross hematuria MSK: negative for foot lesions Neurologic: negative for weakness or gait disturbance  Objective  Vitals: BP 118/72   Pulse 80   Temp 97.8 F (36.6 C)   Ht 5' 8 (1.727 m)   Wt 199 lb 6.4 oz (90.4 kg)   SpO2 98%    BMI 30.32 kg/m  General: well appearing, no acute distress  Psych:  Alert and oriented, normal mood and affect  Diabetic education: ongoing education regarding chronic disease management for diabetes was given today. We continue to reinforce the ABC's of diabetic management: A1c (<7 or 8 dependent upon patient), tight blood pressure control, and cholesterol management with goal LDL < 100 minimally. We discuss diet strategies, exercise recommendations, medication options and possible side effects. At each visit, we review recommended immunizations and preventive care recommendations for diabetics and stress that good diabetic control can prevent other problems. See below for this patient's data. Commons side effects, risks, benefits, and alternatives for medications and treatment plan prescribed today were discussed, and the patient expressed understanding of the given instructions. Patient is instructed to call or message via MyChart if he/she has any questions or concerns regarding our treatment plan. No barriers to understanding were identified. We discussed Red Flag symptoms and signs in detail. Patient expressed understanding regarding what to do in case of urgent or emergency type symptoms.  Medication list was reconciled, printed and provided to the patient in AVS. Patient instructions and summary information was reviewed with the patient as documented in the AVS. This note was prepared with assistance of Dragon voice recognition software. Occasional wrong-word or sound-a-like substitutions may have occurred due to the inherent limitations of voice recognition software

## 2023-12-29 ENCOUNTER — Encounter: Payer: Self-pay | Admitting: Family Medicine

## 2023-12-29 DIAGNOSIS — Z8349 Family history of other endocrine, nutritional and metabolic diseases: Secondary | ICD-10-CM

## 2024-01-26 ENCOUNTER — Other Ambulatory Visit: Payer: Self-pay | Admitting: Family Medicine

## 2024-02-01 ENCOUNTER — Encounter: Payer: Self-pay | Admitting: Family Medicine

## 2024-02-01 DIAGNOSIS — Z8349 Family history of other endocrine, nutritional and metabolic diseases: Secondary | ICD-10-CM | POA: Insufficient documentation

## 2024-02-13 ENCOUNTER — Other Ambulatory Visit: Payer: Self-pay | Admitting: Family Medicine

## 2024-02-21 ENCOUNTER — Encounter: Payer: Self-pay | Admitting: Family Medicine

## 2024-02-21 ENCOUNTER — Ambulatory Visit: Payer: Managed Care, Other (non HMO) | Admitting: Family Medicine

## 2024-02-21 VITALS — BP 116/73 | HR 93 | Temp 98.1°F | Ht 68.0 in | Wt 194.8 lb

## 2024-02-21 DIAGNOSIS — Z7985 Long-term (current) use of injectable non-insulin antidiabetic drugs: Secondary | ICD-10-CM | POA: Diagnosis not present

## 2024-02-21 DIAGNOSIS — Z23 Encounter for immunization: Secondary | ICD-10-CM

## 2024-02-21 DIAGNOSIS — E119 Type 2 diabetes mellitus without complications: Secondary | ICD-10-CM

## 2024-02-21 DIAGNOSIS — E1169 Type 2 diabetes mellitus with other specified complication: Secondary | ICD-10-CM | POA: Diagnosis not present

## 2024-02-21 DIAGNOSIS — Z0001 Encounter for general adult medical examination with abnormal findings: Secondary | ICD-10-CM

## 2024-02-21 DIAGNOSIS — E66811 Obesity, class 1: Secondary | ICD-10-CM | POA: Diagnosis not present

## 2024-02-21 DIAGNOSIS — E782 Mixed hyperlipidemia: Secondary | ICD-10-CM | POA: Diagnosis not present

## 2024-02-21 DIAGNOSIS — E349 Endocrine disorder, unspecified: Secondary | ICD-10-CM

## 2024-02-21 LAB — CBC WITH DIFFERENTIAL/PLATELET
Basophils Absolute: 0.1 K/uL (ref 0.0–0.1)
Basophils Relative: 1.2 % (ref 0.0–3.0)
Eosinophils Absolute: 0.3 K/uL (ref 0.0–0.7)
Eosinophils Relative: 4.8 % (ref 0.0–5.0)
HCT: 49.6 % (ref 39.0–52.0)
Hemoglobin: 16.6 g/dL (ref 13.0–17.0)
Lymphocytes Relative: 20 % (ref 12.0–46.0)
Lymphs Abs: 1.2 K/uL (ref 0.7–4.0)
MCHC: 33.5 g/dL (ref 30.0–36.0)
MCV: 89 fl (ref 78.0–100.0)
Monocytes Absolute: 0.4 K/uL (ref 0.1–1.0)
Monocytes Relative: 7.2 % (ref 3.0–12.0)
Neutro Abs: 4.1 K/uL (ref 1.4–7.7)
Neutrophils Relative %: 66.8 % (ref 43.0–77.0)
Platelets: 257 K/uL (ref 150.0–400.0)
RBC: 5.58 Mil/uL (ref 4.22–5.81)
RDW: 14.3 % (ref 11.5–15.5)
WBC: 6.1 K/uL (ref 4.0–10.5)

## 2024-02-21 LAB — MICROALBUMIN / CREATININE URINE RATIO
Creatinine,U: 94.6 mg/dL
Microalb Creat Ratio: UNDETERMINED mg/g (ref 0.0–30.0)
Microalb, Ur: <0.7 mg/dL

## 2024-02-21 LAB — COMPREHENSIVE METABOLIC PANEL WITH GFR
ALT: 31 U/L (ref 0–53)
AST: 29 U/L (ref 0–37)
Albumin: 4.6 g/dL (ref 3.5–5.2)
Alkaline Phosphatase: 36 U/L — ABNORMAL LOW (ref 39–117)
BUN: 20 mg/dL (ref 6–23)
CO2: 30 meq/L (ref 19–32)
Calcium: 10.2 mg/dL (ref 8.4–10.5)
Chloride: 102 meq/L (ref 96–112)
Creatinine, Ser: 1.03 mg/dL (ref 0.40–1.50)
GFR: 75.29 mL/min (ref >60.00)
Glucose, Bld: 108 mg/dL — ABNORMAL HIGH (ref 70–99)
Potassium: 5.1 meq/L (ref 3.5–5.1)
Sodium: 140 meq/L (ref 135–145)
Total Bilirubin: 0.6 mg/dL (ref 0.2–1.2)
Total Protein: 7.4 g/dL (ref 6.0–8.3)

## 2024-02-21 LAB — LIPID PANEL
Cholesterol: 152 mg/dL (ref 0–200)
HDL: 40.7 mg/dL (ref >39.00)
LDL Cholesterol: 69 mg/dL (ref 0–99)
NonHDL: 111.43
Total CHOL/HDL Ratio: 4
Triglycerides: 212 mg/dL — ABNORMAL HIGH (ref 0.0–149.0)
VLDL: 42.4 mg/dL — ABNORMAL HIGH (ref 0.0–40.0)

## 2024-02-21 LAB — HEMOGLOBIN A1C: Hgb A1c MFr Bld: 6.4 % (ref 4.6–6.5)

## 2024-02-21 LAB — TSH: TSH: 2.16 u[IU]/mL (ref 0.35–5.50)

## 2024-02-21 NOTE — Patient Instructions (Signed)
Please return in 6 months for hypertension follow up.   I will release your lab results to you on your MyChart account with further instructions. You may see the results before I do, but when I review them I will send you a message with my report or have my assistant call you if things need to be discussed. Please reply to my message with any questions. Thank you!   If you have any questions or concerns, please don't hesitate to send me a message via MyChart or call the office at [phone]. Thank you for visiting with us today! It's our pleasure caring for you.  

## 2024-02-21 NOTE — Progress Notes (Signed)
 Subjective  Chief Complaint  Patient presents with   Annual Exam   Diabetes    HPI: Karl [name] is a 68 y.o. male who presents to [facility] [place] today for a Male Wellness Visit. He also has the concerns and/or needs as listed above in the chief complaint. These will be addressed in addition to the Health Maintenance Visit.   Wellness Visit: annual visit with health maintenance review and exam   HM: CRC screen current. Doing well: exercising at gym 3x/week. Diet is improved; eating less and increased proteins. Weight trending downward and getting stronger at gym. 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 Eligible for Tdap toda  Body mass index is 29.62 kg/m. Wt Readings from Last 3 Encounters:  02/21/24 194 lb 12.8 oz (88.4 kg)  12/27/23 199 lb 6.4 oz (90.4 kg)  11/19/23 196 lb (88.9 kg)   Chronic disease management visit and/or acute problem visit: Discussed the use of AI scribe software for clinical note transcription with the patient, who gave verbal consent to proceed.  History of Present Illness Karl [name] is a 68 year old male with diabetes who presents for a routine physical exam and diabetes management.  His diabetes management has improved since increasing the dose of Mounjaro . He monitors his blood glucose levels twice daily, in the morning and two hours after eating in the evening, with readings mostly below 100 mg/dL. No hypoglycemic symptoms such as lightheadedness or sweating.  He mentions a recent weight fluctuation, having dropped to 191 pounds last week but now maintaining around 194 pounds. He attributes this to a decreased appetite and reduced food intake, noting that he eats smaller portions than he did a year ago. His diet includes sourdough bread, English muffins, watermelon, soup, salad, sandwiches, chicken, pork chops, and occasionally steak. He supplements his protein intake with a chocolate-flavored protein mix.  He  exercises regularly, attending the gym three times a week. He reports increased strength, noting improvements in leg and chest presses.  He recently had blood work done for testosterone  and estrogen levels, with testosterone  reported at 480 ng/dL. He was informed of a UTI by another office, despite having no symptoms, and completed a 10-day course of antibiotics.  He and Gwen are often tired due to their dog's medication schedule, which requires dosing at midnight, 8 AM, and 4 PM. Despite this, he feels that working out has helped maintain his energy levels.   Assessment  1. Encounter for well adult exam with abnormal findings   2. Need for influenza vaccination   3. Controlled type 2 diabetes mellitus without complication, without long-term current use of insulin (HCC)   4. Long-term (current) use of injectable non-insulin antidiabetic drugs   5. Combined hyperlipidemia associated with type 2 diabetes mellitus (HCC)   6. Obesity (BMI 30.0-34.9)   7. Need for Tdap vaccination   8. Hypotestosteronism      Plan  Male Wellness Visit: Age appropriate Health Maintenance and Prevention measures were discussed with patient. Included topics are cancer screening recommendations, ways to keep healthy (see AVS) including dietary and exercise recommendations, regular eye and dental care, use of seat belts, and avoidance of moderate alcohol use and tobacco use.  BMI: discussed patient's BMI and encouraged positive lifestyle modifications to help get to or maintain a target BMI. HM needs and immunizations were addressed and ordered. See below for orders. See HM and immunization section for updates. Routine labs and screening tests ordered  including cmp, cbc and lipids where appropriate. Discussed recommendations regarding Vit D and calcium  supplementation (see AVS)  Chronic disease f/u and/or acute problem visit: (deemed necessary to be done in addition to the wellness visit): Assessment and  Plan Assessment & Plan Type 2 diabetes mellitus Type 2 diabetes managed with Mounjaro . Blood glucose well-controlled, fasting levels below 100 mg/dL. No hypoglycemia. Recent weight changes likely due to increased muscle mass from exercise. - Check hemoglobin A1c for long-term glucose control. - Continue Mounjaro . - Adjust treatment if A1c not at target.  Obesity, class 1 Class 1 obesity with weight stabilization at 194 pounds. Engaged in exercise and dietary modifications, including increased protein intake and reduced portions. Weight changes may be due to increased muscle mass from exercise. - Continue exercise regimen, gym three times weekly. - Maintain dietary modifications focusing on protein.  Adult Wellness Visit Routine wellness visit. Discussed health and lifestyle, including diet and exercise. Engaged in physical activity with dietary adjustments. Reports increased muscle mass and strength from exercise. - Perform blood work for health assessment. - Administer tetanus and pertussis booster.   Follow up: 6 mo for recheck Commons side effects, risks, benefits, and alternatives for medications and treatment plan prescribed today were discussed, and the patient expressed understanding of the given instructions. Patient is instructed to call or message via MyChart if he/she has any questions or concerns regarding our treatment plan. No barriers to understanding were identified. We discussed Red Flag symptoms and signs in detail. Patient expressed understanding regarding what to do in case of urgent or emergency type symptoms.  Medication list was reconciled, printed and provided to the patient in AVS. Patient instructions and summary information was reviewed with the patient as documented in the AVS. This note was prepared with assistance of Dragon voice recognition software. Occasional wrong-word or sound-a-like substitutions may have occurred due to the inherent limitations of voice  recognition software  Orders Placed This Encounter  Procedures   Flu vaccine HIGH DOSE PF(Fluzone Trivalent)   Tdap vaccine greater than or equal to 7yo IM   CBC with Differential/Platelet   Comprehensive metabolic panel with GFR   Lipid panel   Hemoglobin A1c   TSH   Microalbumin / creatinine urine ratio   No orders of the defined types were placed in this encounter.    Patient Active Problem List   Diagnosis Date Noted   Hypotestosteronism 11/23/2023   Combined hyperlipidemia associated with type 2 diabetes mellitus (HCC) 08/04/2021   Ureteral calculi 08/04/2021   Controlled type 2 diabetes mellitus without complication, without long-term current use of insulin (HCC) 12/10/2017   Obesity (BMI 30.0-34.9) 09/16/2017   Lumbar herniated disc 09/16/2017   Left lumbar radiculopathy 09/16/2017   Osteoarthritis, hand 09/16/2017   Recurrent sinusitis 12/16/2015   Degenerative tear of left medial meniscus 12/10/2017   Heat rash 09/16/2017   Non-seasonal allergic rhinitis 12/16/2015   Family history of hemochromatosis 02/01/2024   Health Maintenance  Topic Date Due   Diabetic kidney evaluation - eGFR measurement  02/18/2024   COVID-19 Vaccine (5 - 2025-26 season) 03/08/2024 (Originally 01/31/2024)   HEMOGLOBIN A1C  05/20/2024   Diabetic kidney evaluation - Urine ACR  08/18/2024   OPHTHALMOLOGY EXAM  11/28/2024   FOOT EXAM  02/20/2025   Colonoscopy  01/31/2027   DTaP/Tdap/Td (3 - Td or Tdap) 02/20/2034   Pneumococcal Vaccine: 50+ Years  Completed   Influenza Vaccine  Completed   Hepatitis C Screening  Completed   Zoster Vaccines- Shingrix   Completed   HPV VACCINES  Aged Out   Meningococcal B Vaccine  Aged Out   Immunization History  Administered Date(s) Administered   Fluad Quad(high Dose 65+) 02/12/2022   INFLUENZA, HIGH DOSE SEASONAL PF 02/21/2024   Influenza Inj Mdck Quad Pf 03/08/2017, 01/27/2018   Influenza, Quadrivalent, Recombinant, Inj, Pf 03/27/2020    Influenza,inj,Quad PF,6+ Mos 02/27/2021   Moderna Covid-19 Vaccine Bivalent Booster 31yrs & up 05/20/2021   PFIZER(Purple Top)SARS-COV-2 Vaccination 08/15/2019, 09/01/2019, 03/20/2020   PNEUMOCOCCAL CONJUGATE-20 05/09/2021   Pneumococcal Polysaccharide-23 03/21/2018   Tdap 06/01/2013, 02/21/2024   Zoster Recombinant(Shingrix ) 12/10/2017, 06/22/2018   We updated and reviewed the patient's past history in detail and it is documented below. Allergies: Patient has no known allergies. Past Medical History  has a past medical history of Arthritis, Chicken pox, Degenerative tear of left medial meniscus (12/10/2017), Diet-controlled diabetes mellitus (HCC) (12/10/2017), High triglycerides, Hyperlipidemia, Left lumbar radiculopathy (09/16/2017), Lumbar herniated disc (09/16/2017), Microscopic hematuria (05/09/2021), and Personal history of kidney stones. Past Surgical History Patient  has a past surgical history that includes Tonsilectomy, adenoidectomy, bilateral myringotomy and tubes; Tracheostomy; Kidney stone surgery (06/06/2021); Extracorporeal shock wave lithotripsy (Right, 07/24/2022); and Rotator cuff repair (Right, 05/2023). Social History Patient  reports that he has quit smoking. He has never used smokeless tobacco. He reports that he does not currently use alcohol. He reports that he does not use drugs. Family History family history includes Arthritis in his father and maternal grandmother; Asthma in his mother; Cancer in his maternal grandfather, maternal grandmother, paternal grandfather, and paternal grandmother; Depression in his sister; Diabetes in his mother; Heart attack in his father; Heart disease in his father and paternal grandfather; Hemochromatosis in his brother; Hyperlipidemia in his brother, father, and mother; Kidney disease in his mother; Mental illness in his sister; Miscarriages / Stillbirths in his mother; Stroke in his mother. Review of Systems: Constitutional: negative  for fever or malaise Ophthalmic: negative for photophobia, double vision or loss of vision Cardiovascular: negative for chest pain, dyspnea on exertion, or new LE swelling Respiratory: negative for SOB or persistent cough Gastrointestinal: negative for abdominal pain, change in bowel habits or melena Genitourinary: negative for dysuria or gross hematuria Musculoskeletal: negative for new gait disturbance or muscular weakness Integumentary: negative for new or persistent rashes Neurological: negative for TIA or stroke symptoms Psychiatric: negative for SI or delusions Allergic/Immunologic: negative for hives  Patient Care Team    Relationship Specialty Notifications Start End  Jodie Lavern CROME, MD PCP - General Family Medicine  09/16/17   Carlie Clark, MD Consulting Physician Otolaryngology  09/16/17   Melodi Lerner, MD Consulting Physician Orthopedic Surgery  09/16/17   Debarah Lorrene DEL., MD  Ophthalmology  09/16/17   Dr. Myron    09/16/17    Comment: Dentist  Carolee Sherwood JONETTA DOUGLAS, MD Consulting Physician Urology  05/09/21    Objective  Vitals: BP 116/73   Pulse 93   Temp 98.1 F (36.7 C)   Ht 5' 8 (1.727 m)   Wt 194 lb 12.8 oz (88.4 kg)   SpO2 100%   BMI 29.62 kg/m  General:  Well developed, well nourished, no acute distress  Psych:  Alert and orientedx3,normal mood and affect HEENT:  Normocephalic, atraumatic, non-icteric sclera,  oropharynx is clear without mass or exudate, supple neck without adenopathy, or thyromegaly Cardiovascular:  Normal S1, S2, RRR without gallop, rub or murmur,  Respiratory:  Good breath sounds bilaterally, CTAB with normal respiratory effort Gastrointestinal: normal bowel sounds, soft, non-tender,  no noted masses. No HSM MSK: Joints are without erythema or swelling.  Skin:  Warm, no rashes Neurologic:    Mental status is normal.  Gross motor and sensory exams are normal. Stable gait. No tremor

## 2024-02-22 ENCOUNTER — Ambulatory Visit: Payer: Self-pay | Admitting: Family Medicine

## 2024-02-22 NOTE — Progress Notes (Signed)
 See mychart note Dear Mr. Swor, Everything looks good!  Diabetic control is much improved.  Cholesterol is stable although triglycerides are little bit above goal.  I did not call if you were fasting or not.  Avoiding processed carbohydrates will help this number.  I do not think we need to add any medications at this time.  It was good seeing you in talking with you yesterday Sincerely, Dr. Jodie

## 2024-03-17 ENCOUNTER — Ambulatory Visit: Admitting: Family Medicine

## 2024-03-17 ENCOUNTER — Encounter: Payer: Self-pay | Admitting: Family Medicine

## 2024-03-17 VITALS — BP 118/74 | HR 94 | Temp 98.1°F | Ht 68.0 in | Wt 190.8 lb

## 2024-03-17 DIAGNOSIS — E66811 Obesity, class 1: Secondary | ICD-10-CM

## 2024-03-17 DIAGNOSIS — Z029 Encounter for administrative examinations, unspecified: Secondary | ICD-10-CM

## 2024-03-17 DIAGNOSIS — E119 Type 2 diabetes mellitus without complications: Secondary | ICD-10-CM

## 2024-03-17 DIAGNOSIS — Z7984 Long term (current) use of oral hypoglycemic drugs: Secondary | ICD-10-CM

## 2024-03-17 NOTE — Progress Notes (Signed)
 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 male who presents to the office today for follow up of diabetes and problems listed above in the chief complaint.  Discussed the use of AI scribe software for clinical note transcription with the patient, who gave verbal consent to proceed.  History of Present Illness Karl [name] is a 68 year old male with type 2 diabetes who presents for [facility] medical certification issues.  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 - Hemoglobin A1c improved from 8.1% to 6.4%, with most recent value on February 21, 2024, within 90 days of recertification as required by [employer] - Regular self-monitoring of blood glucose, with a recent low of 68 mg/dL in the evening - Considering adjustment of metformin  dosage if fasting blood glucose consistently falls below 80 mg/dL  Postoperative shoulder pain and steroid exposure - History of shoulder injury and subsequent surgery - Possible contribution of surgical stress and steroid exposure to blood glucose fluctuations - No recollection of perioperative steroid injections, but received injections one to two months postoperatively, possibly cortisone  Obesity: weight continues to trend downward.  See FAA completed paperwork  Wt Readings from Last 3 Encounters:  03/17/24 190 lb 12.8 oz (86.5 kg)  02/21/24 194 lb 12.8 oz (88.4 kg)  12/27/23 199 lb 6.4 oz (90.4 kg)    BP Readings from Last 3 Encounters:  03/17/24 118/74  02/21/24 116/73  12/27/23 118/72    Assessment  1. Controlled type 2 diabetes mellitus without complication, without long-term current use of insulin  (HCC)   2. Obesity (BMI 30.0-34.9)   3. Administrative encounter      Plan  Assessment and Plan Assessment & Plan Type 2 diabetes mellitus Type 2 diabetes mellitus managed with metformin , Mounjaro , and Jardiance . Recent A1c improved from 8.1 to 6.4. Occasional hypoglycemia with a recent blood glucose reading of 68. Weight loss to 188 lbs. Potential reduction of metformin  if fasting glucose consistently below 80. Consideration of FAA documentation submission through Dr. Reggy at [facility]. - Monitor blood glucose levels, especially fasting levels, to assess need for medication adjustment. - Consider reducing metformin  dose if fasting glucose consistently below 80. - Continue current diabetes medication regimen with metformin , Mounjaro , and Jardiance . - Submit necessary documentation to [facility] for medical certification, possibly through Dr. Reggy at [facility].  General Health Maintenance Discussion about exercise and diet to support overall health and diabetes management. - Encourage regular physical activity and strength training. - Monitor dietary intake to support diabetes management.    Follow up: 3 mo for dm recheck  No orders of the defined types were placed in this encounter.  No orders of the defined types were placed in this encounter.     Immunization History  Administered Date(s) Administered   Fluad Quad(high Dose 65+) 02/12/2022   INFLUENZA, HIGH DOSE SEASONAL PF 02/21/2024   Influenza Inj Mdck Quad Pf 03/08/2017, 01/27/2018   Influenza, Quadrivalent, Recombinant, Inj, Pf 03/27/2020   Influenza,inj,Quad PF,6+ Mos 02/27/2021   Moderna Covid-19 Vaccine Bivalent Booster 31yrs & up 05/20/2021, 03/09/2024   PFIZER(Purple Top)SARS-COV-2 Vaccination 08/15/2019, 09/01/2019, 03/20/2020   PNEUMOCOCCAL CONJUGATE-20 05/09/2021   Pneumococcal Polysaccharide-23 03/21/2018  Tdap 06/01/2013, 02/21/2024   Zoster Recombinant(Shingrix ) 12/10/2017, 06/22/2018    Diabetes Related Lab  Review: Lab Results  Component Value Date   HGBA1C 6.4 02/21/2024   HGBA1C 7.5 (A) 11/19/2023   HGBA1C 8.1 (A) 08/19/2023    Lab Results  Component Value Date   MICROALBUR <0.7 02/21/2024   Lab Results  Component Value Date   CREATININE 1.03 02/21/2024   BUN 20 02/21/2024   NA 140 02/21/2024   K 5.1 02/21/2024   CL 102 02/21/2024   CO2 30 02/21/2024   Lab Results  Component Value Date   CHOL 152 02/21/2024   CHOL 122 02/18/2023   CHOL 118 02/12/2022   Lab Results  Component Value Date   HDL 40.70 02/21/2024   HDL 47.60 02/18/2023   HDL 44.00 02/12/2022   Lab Results  Component Value Date   LDLCALC 69 02/21/2024   LDLCALC 50 02/18/2023   LDLCALC 47 02/12/2022   Lab Results  Component Value Date   TRIG 212.0 (H) 02/21/2024   TRIG 122.0 02/18/2023   TRIG 138.0 02/12/2022   Lab Results  Component Value Date   CHOLHDL 4 02/21/2024   CHOLHDL 3 02/18/2023   CHOLHDL 3 02/12/2022   No results found for: LDLDIRECT The 10-year ASCVD risk score (Arnett DK, et al., 2019) is: 22.1%   Values used to calculate the score:     Age: 89 years     Clincally relevant sex: Male     Is Non-Hispanic African American: No     Diabetic: Yes     Tobacco smoker: No     Systolic Blood Pressure: 118 mmHg     Is BP treated: No     HDL Cholesterol: 40.7 mg/dL     Total Cholesterol: 152 mg/dL I have reviewed the PMH, Fam and Soc history. Patient Active Problem List   Diagnosis Date Noted   Hypotestosteronism 11/23/2023    Priority: High    Totoal 300s, low Free T4, 10/2023    Combined hyperlipidemia associated with type 2 diabetes mellitus (HCC) 08/04/2021    Priority: High   Ureteral calculi 08/04/2021    Priority: High    Work-up for microscopic hematuria: Ureteral calculi with right hydronephrosis: Status post stone removal and stent placement.  Normal follow-up 07/2021.  Dr. Carolee, urology    Controlled type 2 diabetes mellitus without complication, without long-term  current use of insulin (HCC) 12/10/2017    Priority: High    Jardiance  25, met 1000 bid, added mounjaro  10/2023    Obesity (BMI 30.0-34.9) 09/16/2017    Priority: High   Lumbar herniated disc 09/16/2017    Priority: Medium     L3,4,5 by MRI, NeuroSurgery    Left lumbar radiculopathy 09/16/2017    Priority: Medium     Evaluated by NS and treated with PT, [place].     Osteoarthritis, hand 09/16/2017    Priority: Medium    Recurrent sinusitis 12/16/2015    Priority: Medium    Degenerative tear of left medial meniscus 12/10/2017    Priority: Low    Dr. Hiram    Heat rash 09/16/2017    Priority: Low    Recurring;     Non-seasonal allergic rhinitis 12/16/2015    Priority: Low   Family history of hemochromatosis 02/01/2024    Younger brother: H63D homozygosity Rec screening: serum iron studies (transferrin saturation and serum ferritin levels).[2-4]  If iron studies are abnormal, HFE genetic testing should be performed     Social  History: Patient  reports that he has quit smoking. He has never used smokeless tobacco. He reports that he does not currently use alcohol. He reports that he does not use drugs.  Review of Systems: Ophthalmic: negative for eye pain, loss of vision or double vision Cardiovascular: negative for chest pain Respiratory: negative for SOB or persistent cough Gastrointestinal: negative for abdominal pain Genitourinary: negative for dysuria or gross hematuria MSK: negative for foot lesions Neurologic: negative for weakness or gait disturbance  Objective  Vitals: BP 118/74   Pulse 94   Temp 98.1 F (36.7 C)   Ht 5' 8 (1.727 m)   Wt 190 lb 12.8 oz (86.5 kg)   SpO2 98%   BMI 29.01 kg/m  General: well appearing, no acute distress  Psych:  Alert and oriented, normal mood and affect  education regarding chronic disease management for diabetes was given today. We continue to reinforce the ABC's of diabetic management: A1c (<7 or 8 dependent upon  patient), tight blood pressure control, and cholesterol management with goal LDL < 100 minimally. We discuss diet strategies, exercise recommendations, medication options and possible side effects. At each visit, we review recommended immunizations and preventive care recommendations for diabetics and stress that good diabetic control can prevent other problems. See below for this patient's data. Commons side effects, risks, benefits, and alternatives for medications and treatment plan prescribed today were discussed, and the patient expressed understanding of the given instructions. Patient is instructed to call or message via MyChart if he/she has any questions or concerns regarding our treatment plan. No barriers to understanding were identified. We discussed Red Flag symptoms and signs in detail. Patient expressed understanding regarding what to do in case of urgent or emergency type symptoms.  Medication list was reconciled, printed and provided to the patient in AVS. Patient instructions and summary information was reviewed with the patient as documented in the AVS. This note was prepared with assistance of Dragon voice recognition software. Occasional wrong-word or sound-a-like substitutions may have occurred due to the inherent limitations of voice recognition software

## 2024-05-23 ENCOUNTER — Encounter: Payer: Self-pay | Admitting: Family Medicine

## 2024-05-23 ENCOUNTER — Ambulatory Visit: Admitting: Family Medicine

## 2024-05-23 VITALS — BP 132/70 | HR 89 | Temp 97.2°F | Ht 68.0 in | Wt 188.2 lb

## 2024-05-23 DIAGNOSIS — M25511 Pain in right shoulder: Secondary | ICD-10-CM

## 2024-05-23 DIAGNOSIS — E119 Type 2 diabetes mellitus without complications: Secondary | ICD-10-CM | POA: Diagnosis not present

## 2024-05-23 DIAGNOSIS — Z8349 Family history of other endocrine, nutritional and metabolic diseases: Secondary | ICD-10-CM | POA: Diagnosis not present

## 2024-05-23 DIAGNOSIS — Z7985 Long-term (current) use of injectable non-insulin antidiabetic drugs: Secondary | ICD-10-CM | POA: Diagnosis not present

## 2024-05-23 LAB — CBC WITH DIFFERENTIAL/PLATELET
Basophils Absolute: 0.1 K/uL (ref 0.0–0.1)
Basophils Relative: 0.9 % (ref 0.0–3.0)
Eosinophils Absolute: 0.4 K/uL (ref 0.0–0.7)
Eosinophils Relative: 4.1 % (ref 0.0–5.0)
HCT: 49.7 % (ref 39.0–52.0)
Hemoglobin: 16.7 g/dL (ref 13.0–17.0)
Lymphocytes Relative: 21.4 % (ref 12.0–46.0)
Lymphs Abs: 1.8 K/uL (ref 0.7–4.0)
MCHC: 33.5 g/dL (ref 30.0–36.0)
MCV: 90.6 fl (ref 78.0–100.0)
Monocytes Absolute: 0.5 K/uL (ref 0.1–1.0)
Monocytes Relative: 5.4 % (ref 3.0–12.0)
Neutro Abs: 5.8 K/uL (ref 1.4–7.7)
Neutrophils Relative %: 68.2 % (ref 43.0–77.0)
Platelets: 259 K/uL (ref 150.0–400.0)
RBC: 5.48 Mil/uL (ref 4.22–5.81)
RDW: 13.5 % (ref 11.5–15.5)
WBC: 8.6 K/uL (ref 4.0–10.5)

## 2024-05-23 LAB — HEMOGLOBIN A1C: Hgb A1c MFr Bld: 5.6 % (ref 4.6–6.5)

## 2024-05-23 NOTE — Progress Notes (Signed)
 "  Subjective  CC:  Chief Complaint  Patient presents with   Diabetes    A1C check and diabetes monitoring. Wanting to talk about stopping metformin . Also has a medical form that needs completing.    Shoulder Pain    Is having some right shoulder pain.HX of bursitis. Heard some pooping/ crunch when lifting 10lb weight in the gym.     HPI: Karl [name] is a 68 y.o. [name] who presents to the office today for follow up of diabetes and problems listed above in the chief complaint.  Discussed the use of AI scribe software for clinical note transcription with the patient, who gave verbal consent to proceed.  History of Present Illness Karl [name] is a 68 year old [name] with diabetes who presents for follow-up regarding his diabetic control and shoulder injury.  Glycemic control/ dm f/u - Diabetes mellitus with improved glycemic control over time: HbA1c previously 8.1%, then 7.5%, in september 6.4% - Daily blood glucose monitoring with recent readings between 84-106 mg/dL - Current medications: metformin  1000 mg twice daily and Mounjaro  and jardiance , both well tolerated - No adverse effects from metformin  - Diabetes has impacted ability to return to work - no complications  Shoulder pain and musculoskeletal symptoms - History of shoulder injury with prior diagnosis of bursitis - Recent 'pop' in shoulder during exercise, resulting in tenderness but no pain - Ongoing tenderness in bicep tendon area, especially with certain exercises - Belief that shoulder injury and related stress may have affected blood glucose levels  Weight loss and dietary management - Progressive weight loss to current weight of 185 pounds, down from previous high of 250-255 pounds - Weight loss attributed to dietary changes and regular exercise - Uses protein powder with milk for breakfast - Careful dietary management, including during holiday meals  Occupational and insurance issues - Currently unable  to return to work due to medical condition - Involved in legal proceedings related to workers' compensation - Recently retired and transitioning to [employer], with ongoing insurance changes    Wt Readings from Last 3 Encounters:  05/23/24 188 lb 3.2 oz (85.4 kg)  03/17/24 190 lb 12.8 oz (86.5 kg)  02/21/24 194 lb 12.8 oz (88.4 kg)    BP Readings from Last 3 Encounters:  05/23/24 132/70  03/17/24 118/74  02/21/24 116/73    Assessment  1. Controlled type 2 diabetes mellitus without complication, without long-term current use of insulin (HCC)   2. Family history of hemochromatosis   3. Long-term (current) use of injectable non-insulin antidiabetic drugs   4. Right shoulder pain, unspecified chronicity      Plan  Assessment and Plan Assessment & Plan Type 2 diabetes mellitus Well-controlled with current medications. Morning blood glucose levels range from 84 to 106 mg/dL. A1c has improved from 8.1% to 6.4%. Metformin  is well-tolerated without significant side effects. Mounjaro  is also well-tolerated. Discussion about potentially increasing Mounjaro  and decreasing metformin , but current regimen is effective.  - Ordered A1c test - Ordered iron studies - Continue metformin  1000 mg twice daily - Continue Mounjaro    Right shoulder pain and biceps tendinitis Right shoulder pain with biceps tendinitis, likely exacerbated by recent gym activities. Reports popping sensation and tenderness without significant pain. Previous diagnosis of bursitis by orthopedic surgeon. Advised to avoid exercises that strain the bicep tendon. - Apply ice to the affected area - Take anti-inflammatory medication for three days - Avoid exercises that strain the bicep tendon  FH of hemochromatosis: will  screen cbc and iron studies; if abnormal will need genetic testing.   Obesity Management is ongoing with current weight around 185 lbs, down from a previous high of 255 lbs. Weight loss has plateaued at times  but continues to trend downwards. Current regimen includes Mounjaro  and dietary modifications with protein supplementation. Discussion about potentially increasing Mounjaro  for further weight loss, but current regimen is effective. - Continue current weight management regimen - Consider increasing Mounjaro  if further weight loss is desired  General Health Maintenance Discussion about upcoming appointments and insurance transitions. Currently on [employer] and transitioning to Medicare B. Appointment scheduled with financial advisor for January enrollment. - Continue with scheduled appointment for Medicare B enrollment    Follow up: 6 mo for recheck Orders Placed This Encounter  Procedures   Hemoglobin A1c   Iron, TIBC and Ferritin Panel   CBC with Differential/Platelet   No orders of the defined types were placed in this encounter.     Immunization History  Administered Date(s) Administered   Fluad Quad(high Dose 65+) 02/12/2022   INFLUENZA, HIGH DOSE SEASONAL PF 02/21/2024   Influenza Inj Mdck Quad Pf 03/08/2017, 01/27/2018   Influenza, Quadrivalent, Recombinant, Inj, Pf 03/27/2020   Influenza,inj,Quad PF,6+ Mos 02/27/2021   Moderna Covid-19 Vaccine Bivalent Booster 37yrs & up 05/20/2021, 03/09/2024   PFIZER(Purple Top)SARS-COV-2 Vaccination 08/15/2019, 09/01/2019, 03/20/2020   PNEUMOCOCCAL CONJUGATE-20 05/09/2021   Pneumococcal Polysaccharide-23 03/21/2018   Tdap 06/01/2013, 02/21/2024   Zoster Recombinant(Shingrix ) 12/10/2017, 06/22/2018    Diabetes Related Lab Review: Lab Results  Component Value Date   HGBA1C 6.4 02/21/2024   HGBA1C 7.5 (A) 11/19/2023   HGBA1C 8.1 (A) 08/19/2023    Lab Results  Component Value Date   MICROALBUR <0.7 02/21/2024   Lab Results  Component Value Date   CREATININE 1.03 02/21/2024   BUN 20 02/21/2024   NA 140 02/21/2024   K 5.1 02/21/2024   CL 102 02/21/2024   CO2 30 02/21/2024   Lab Results  Component Value Date   CHOL 152  02/21/2024   CHOL 122 02/18/2023   CHOL 118 02/12/2022   Lab Results  Component Value Date   HDL 40.70 02/21/2024   HDL 47.60 02/18/2023   HDL 44.00 02/12/2022   Lab Results  Component Value Date   LDLCALC 69 02/21/2024   LDLCALC 50 02/18/2023   LDLCALC 47 02/12/2022   Lab Results  Component Value Date   TRIG 212.0 (H) 02/21/2024   TRIG 122.0 02/18/2023   TRIG 138.0 02/12/2022   Lab Results  Component Value Date   CHOLHDL 4 02/21/2024   CHOLHDL 3 02/18/2023   CHOLHDL 3 02/12/2022   No results found for: LDLDIRECT The 10-year ASCVD risk score (Arnett DK, et al., 2019) is: 26.2%   Values used to calculate the score:     Age: 63 years     Clinically relevant sex: [name]     Is Non-Hispanic African American: No     Diabetic: Yes     Tobacco smoker: No     Systolic Blood Pressure: 132 mmHg     Is BP treated: No     HDL Cholesterol: 40.7 mg/dL     Total Cholesterol: 152 mg/dL I have reviewed the PMH, Fam and Soc history. Patient Active Problem List   Diagnosis Date Noted Date Diagnosed   Hypotestosteronism 11/23/2023     Priority: High    Totoal 300s, low Free T4, 10/2023    Combined hyperlipidemia associated with type 2 diabetes mellitus (HCC) 08/04/2021  Priority: High   Ureteral calculi 08/04/2021     Priority: High    Work-up for microscopic hematuria: Ureteral calculi with right hydronephrosis: Status post stone removal and stent placement.  Normal follow-up 07/2021.  Dr. Carolee, urology    Controlled type 2 diabetes mellitus without complication, without long-term current use of insulin (HCC) 12/10/2017     Priority: High    Jardiance  25, met 1000 bid, added mounjaro  10/2023    Obesity (BMI 30.0-34.9) 09/16/2017     Priority: High   Lumbar herniated disc 09/16/2017     Priority: Medium     L3,4,5 by MRI, NeuroSurgery    Left lumbar radiculopathy 09/16/2017     Priority: Medium     Evaluated by NS and treated with PT, [place].     Osteoarthritis,  hand 09/16/2017     Priority: Medium    Recurrent sinusitis 12/16/2015     Priority: Medium    Degenerative tear of left medial meniscus 12/10/2017     Priority: Low    Dr. Hiram    Heat rash 09/16/2017     Priority: Low    Recurring;     Non-seasonal allergic rhinitis 12/16/2015     Priority: Low   Family history of hemochromatosis 02/01/2024     Younger brother: H63D homozygosity Rec screening: serum iron studies (transferrin saturation and serum ferritin levels).[2-4]  If iron studies are abnormal, HFE genetic testing should be performed     Social History: Patient  reports that he has quit smoking. He has never used smokeless tobacco. He reports that he does not currently use alcohol. He reports that he does not use drugs.  Review of Systems: Ophthalmic: negative for eye pain, loss of vision or double vision Cardiovascular: negative for chest pain Respiratory: negative for SOB or persistent cough Gastrointestinal: negative for abdominal pain Genitourinary: negative for dysuria or gross hematuria MSK: negative for foot lesions Neurologic: negative for weakness or gait disturbance  Objective  Vitals: BP 132/70 (BP Location: Left Arm, Patient Position: Sitting, Cuff Size: Normal)   Pulse 89   Temp (!) 97.2 F (36.2 C) (Temporal)   Ht 5' 8 (1.727 m)   Wt 188 lb 3.2 oz (85.4 kg)   SpO2 99%   BMI 28.62 kg/m  General: well appearing, no acute distress  Psych:  Alert and oriented, normal mood and affect  Diabetic education: ongoing education regarding chronic disease management for diabetes was given today. We continue to reinforce the ABC's of diabetic management: A1c (<7 or 8 dependent upon patient), tight blood pressure control, and cholesterol management with goal LDL < 100 minimally. We discuss diet strategies, exercise recommendations, medication options and possible side effects. At each visit, we review recommended immunizations and preventive care recommendations  for diabetics and stress that good diabetic control can prevent other problems. See below for this patient's data. Commons side effects, risks, benefits, and alternatives for medications and treatment plan prescribed today were discussed, and the patient expressed understanding of the given instructions. Patient is instructed to call or message via MyChart if he/she has any questions or concerns regarding our treatment plan. No barriers to understanding were identified. We discussed Red Flag symptoms and signs in detail. Patient expressed understanding regarding what to do in case of urgent or emergency type symptoms.  Medication list was reconciled, printed and provided to the patient in AVS. Patient instructions and summary information was reviewed with the patient as documented in the AVS. This note was prepared  with assistance of Conservation officer, historic buildings. Occasional wrong-word or sound-a-like substitutions may have occurred due to the inherent limitations of voice recognition software   "

## 2024-05-24 LAB — IRON,TIBC AND FERRITIN PANEL
%SAT: 39 % (ref 20–48)
Ferritin: 50 ng/mL (ref 24–380)
Iron: 131 ug/dL (ref 50–180)
TIBC: 335 ug/dL (ref 250–425)

## 2024-05-26 ENCOUNTER — Ambulatory Visit: Payer: Self-pay | Admitting: Family Medicine

## 2024-05-26 NOTE — Progress Notes (Signed)
 See mychart note Dear Mr. Becvar, Your lab results all look good again. Good to see you as always and hope you found your new car! Sincerely, Dr. Jodie

## 2024-05-29 ENCOUNTER — Telehealth: Payer: Self-pay

## 2024-05-29 ENCOUNTER — Ambulatory Visit: Admitting: Family Medicine

## 2024-05-29 NOTE — Telephone Encounter (Signed)
 Copied from CRM #[id number]. Topic: General - Other >> May 29, 2024 10:14 AM Delon DASEN wrote: Reason for CRM: checking to see if form for attorney is ready for pick up for work comp- [phone]

## 2024-06-06 ENCOUNTER — Telehealth: Payer: Self-pay

## 2024-06-06 ENCOUNTER — Other Ambulatory Visit (HOSPITAL_COMMUNITY): Payer: Self-pay

## 2024-06-06 NOTE — Telephone Encounter (Signed)
 Pharmacy Patient Advocate Encounter   Received notification from Onbase CMM KEY that prior authorization for Mounjaro  5MG /0.5ML auto-injectors is required/requested.   Insurance verification completed.   The patient is insured through [employer].   Per test claim: PA required; PA submitted to above mentioned insurance via Latent Key/confirmation #/EOC [id number] Status is pending

## 2024-06-07 ENCOUNTER — Other Ambulatory Visit (HOSPITAL_COMMUNITY): Payer: Self-pay

## 2024-06-07 NOTE — Telephone Encounter (Signed)
 Pharmacy Patient Advocate Encounter  Received notification from [employer] that Prior Authorization for Mounjaro  5MG /0.5ML auto-injectors  has been APPROVED from 06/01/24 to 05/31/25. Unable to obtain price due to refill too soon rejection, last fill date 06/05/24 next available fill date01/26/26   PA #/Case ID/Reference #: [id number]

## 2024-06-27 ENCOUNTER — Other Ambulatory Visit: Payer: Self-pay | Admitting: Family Medicine

## 2024-11-21 ENCOUNTER — Ambulatory Visit: Payer: Self-pay | Admitting: Family Medicine

## 2025-02-21 ENCOUNTER — Encounter: Admitting: Family Medicine
